# Patient Record
Sex: Male | Born: 1970 | Race: White | Hispanic: No | Smoking: Current every day smoker
Health system: Southern US, Community
[De-identification: ages and names within clinical notes are randomized; demographics above are authoritative.]

## PROBLEM LIST (undated history)

## (undated) DIAGNOSIS — S52501A Unspecified fracture of the lower end of right radius, initial encounter for closed fracture: Secondary | ICD-10-CM

## (undated) DIAGNOSIS — E785 Hyperlipidemia, unspecified: Secondary | ICD-10-CM

## (undated) DIAGNOSIS — F1910 Other psychoactive substance abuse, uncomplicated: Secondary | ICD-10-CM

## (undated) DIAGNOSIS — F1010 Alcohol abuse, uncomplicated: Secondary | ICD-10-CM

## (undated) HISTORY — PX: SHOULDER SURGERY: SHX246

---

## 2001-01-19 ENCOUNTER — Encounter: Payer: Self-pay | Admitting: *Deleted

## 2001-01-19 ENCOUNTER — Emergency Department (HOSPITAL_COMMUNITY): Admission: EM | Admit: 2001-01-19 | Discharge: 2001-01-19 | Payer: Self-pay | Admitting: *Deleted

## 2001-07-02 ENCOUNTER — Emergency Department (HOSPITAL_COMMUNITY): Admission: EM | Admit: 2001-07-02 | Discharge: 2001-07-02 | Payer: Self-pay | Admitting: Internal Medicine

## 2001-08-05 ENCOUNTER — Emergency Department (HOSPITAL_COMMUNITY): Admission: EM | Admit: 2001-08-05 | Discharge: 2001-08-06 | Payer: Self-pay | Admitting: Emergency Medicine

## 2001-08-06 ENCOUNTER — Encounter: Payer: Self-pay | Admitting: Emergency Medicine

## 2001-10-19 ENCOUNTER — Encounter: Payer: Self-pay | Admitting: *Deleted

## 2001-10-19 ENCOUNTER — Emergency Department (HOSPITAL_COMMUNITY): Admission: EM | Admit: 2001-10-19 | Discharge: 2001-10-19 | Payer: Self-pay | Admitting: *Deleted

## 2001-11-29 ENCOUNTER — Emergency Department (HOSPITAL_COMMUNITY): Admission: EM | Admit: 2001-11-29 | Discharge: 2001-11-29 | Payer: Self-pay | Admitting: *Deleted

## 2002-09-20 ENCOUNTER — Emergency Department (HOSPITAL_COMMUNITY): Admission: EM | Admit: 2002-09-20 | Discharge: 2002-09-20 | Payer: Self-pay | Admitting: *Deleted

## 2004-12-28 ENCOUNTER — Emergency Department (HOSPITAL_COMMUNITY): Admission: EM | Admit: 2004-12-28 | Discharge: 2004-12-28 | Payer: Self-pay | Admitting: Emergency Medicine

## 2005-01-14 ENCOUNTER — Emergency Department (HOSPITAL_COMMUNITY): Admission: EM | Admit: 2005-01-14 | Discharge: 2005-01-14 | Payer: Self-pay | Admitting: Emergency Medicine

## 2005-02-06 ENCOUNTER — Emergency Department (HOSPITAL_COMMUNITY): Admission: EM | Admit: 2005-02-06 | Discharge: 2005-02-06 | Payer: Self-pay | Admitting: *Deleted

## 2013-08-29 ENCOUNTER — Emergency Department (HOSPITAL_COMMUNITY)
Admission: EM | Admit: 2013-08-29 | Discharge: 2013-08-30 | Disposition: A | Discharge status: Home / Self Care | Payer: Self-pay | Attending: Emergency Medicine | Admitting: Emergency Medicine

## 2013-08-29 ENCOUNTER — Encounter (HOSPITAL_COMMUNITY): Payer: Self-pay | Admitting: Emergency Medicine

## 2013-08-29 DIAGNOSIS — F17200 Nicotine dependence, unspecified, uncomplicated: Secondary | ICD-10-CM | POA: Insufficient documentation

## 2013-08-29 DIAGNOSIS — N419 Inflammatory disease of prostate, unspecified: Secondary | ICD-10-CM | POA: Insufficient documentation

## 2013-08-29 DIAGNOSIS — Z862 Personal history of diseases of the blood and blood-forming organs and certain disorders involving the immune mechanism: Secondary | ICD-10-CM | POA: Insufficient documentation

## 2013-08-29 DIAGNOSIS — Z8639 Personal history of other endocrine, nutritional and metabolic disease: Secondary | ICD-10-CM | POA: Insufficient documentation

## 2013-08-29 HISTORY — DX: Hyperlipidemia, unspecified: E78.5

## 2013-08-29 MED ORDER — CIPROFLOXACIN HCL 500 MG PO TABS: 500.0000 mg | ORAL_TABLET | Freq: Two times a day (BID) | ORAL | Status: DC

## 2013-08-29 MED ORDER — CEFTRIAXONE SODIUM 250 MG IJ SOLR
250.0000 mg | Freq: Once | INTRAMUSCULAR | Status: AC
  Administered 2013-08-30: 250 mg via INTRAMUSCULAR
  Filled 2013-08-29: qty 250

## 2013-08-29 MED ORDER — CIPROFLOXACIN HCL 250 MG PO TABS
500.0000 mg | ORAL_TABLET | Freq: Once | ORAL | Status: AC
  Administered 2013-08-30: 500 mg via ORAL
  Filled 2013-08-29: qty 2

## 2013-08-29 NOTE — ED Provider Notes (Signed)
CSN: [id number]     Arrival date & time 08/29/13  2317 History   First MD Initiated Contact with Patient 08/29/13 2342     Chief Complaint  Patient presents with  . Penis Pain     (Consider location/radiation/quality/duration/timing/severity/associated sxs/prior Treatment) HPI This is a 43 year old male with a two-week history of pain in the end of his penis when he urinates. He states the symptoms are getting worse. He states the pain is severe. He describes it as a sharp pain. He denies urethral discharge. He denies fever, chills, nausea, vomiting or diarrhea. He denies pain with defecation. He states he has not had vaginal intercourse in over 6 months but has had oral sex recently.  Past Medical History  Diagnosis Date  . Hyperlipemia    History reviewed. No pertinent past surgical history. No family history on file. History  Substance Use Topics  . Smoking status: Current Every Day Smoker -- 1.00 packs/day    Types: Cigarettes  . Smokeless tobacco: Not on file  . Alcohol Use: No    Review of Systems  All other systems reviewed and are negative.   Allergies  Review of patient's allergies indicates no known allergies.  Home Medications  No current outpatient prescriptions on file. BP 142/96  Pulse 81  Temp(Src) 97.7 F (36.5 C) (Oral)  Resp 18  Ht 5\' 6"  (1.676 m)  Wt 160 lb (72.576 kg)  BMI 25.84 kg/m2  SpO2 97%  Physical Exam General: Well-developed, well-nourished male in no acute distress; appearance consistent with age of record HENT: normocephalic; atraumatic; edentulous Eyes: pupils equal, round and reactive to light; extraocular muscles intact Neck: supple Heart: regular rate and rhythm Lungs: clear to auscultation bilaterally Abdomen: soft; nondistended; mild suprapubic tenderness; no masses or hepatosplenomegaly; bowel sounds present GU: Tanner 4 male, uncircumcised; no urethral discharge; no meatal erythema or swelling Rectal: Normal sphincter tone;  nontender prostate Extremities: No deformity; full range of motion Neurologic: Awake, alert and oriented; motor function intact in all extremities and symmetric; no facial droop Skin: Warm and dry Psychiatric: Normal mood and affect    ED Course  Procedures (including critical care time)  MDM  We'll treat for prostatitis. GC/Chlamydia pending.    Hanley SeamenJohn L Larra Crunkleton, MD 08/29/13 [phone]

## 2013-08-29 NOTE — ED Notes (Signed)
Pt states having pain at the head of his penis for the past week. Denies any discharge or drainage. Only burning w/ urination.

## 2013-08-29 NOTE — Discharge Instructions (Signed)
Prostatitis The prostate gland is about the size and shape of a walnut. It is located just below your bladder. It produces one of the components of semen, which is made up of sperm and the fluids that help nourish and transport it out from the testicles. Prostatitis is inflammation of the prostate gland.  There are four types of prostatitis:  Acute bacterial prostatitis This is the least common type of prostatitis. It starts quickly and usually is associated with a bladder infection, high fever, and shaking chills. It can occur at any age.  Chronic bacterial prostatitis This is a persistent bacterial infection in the prostate. It usually develops from repeated acute bacterial prostatitis or acute bacterial prostatitis that was not properly treated. It can occur in men of any age but is most common in middle-aged men whose prostate has begun to enlarge. The symptoms are not as severe as those in acute bacterial prostatitis. Discomfort in the part of your body that is in front of your rectum and below your scrotum (perineum), lower abdomen, or in the head of your penis (glans) may represent your primary discomfort.  Chronic prostatitis (nonbacterial) This is the most common type of prostatitis. It is inflammation of the prostate gland that is not caused by a bacterial infection. The cause is unknown and may be associated with a viral infection or autoimmune disorder.  Prostatodynia (pelvic floor disorder) This is associated with increased muscular tone in the pelvis surrounding the prostate. CAUSES The causes of bacterial prostatitis are bacterial infection. The causes of the other types of prostatitis are unknown.  SYMPTOMS  Symptoms can vary depending upon the type of prostatitis that exists. There can also be overlap in symptoms. Possible symptoms for each type of prostatitis are listed below. Acute Bacterial Prostatitis  Painful urination.  Fever or chills.  Muscle or joint pains.  Low  back pain.  Low abdominal pain.  Inability to empty bladder completely. Chronic Bacterial Prostatitis, Chronic Nonbacterial Prostatitis, and Prostatodynia  Sudden urge to urinate.  Frequent urination.  Difficulty starting urine stream.  Weak urine stream.  Discharge from the urethra.  Dribbling after urination.  Rectal pain.  Pain in the testicles, penis, or tip of the penis.  Pain in the perineum.  Problems with sexual function.  Painful ejaculation.  Bloody semen. DIAGNOSIS  In order to diagnose prostatitis, your health care provider will ask about your symptoms. One or more urine samples will be taken and tested (urinalysis). If the urinalysis result is negative for bacteria, your health care provider may use a finger to feel your prostate (digital rectal exam). This exam helps your health care provider determine if your prostate is swollen and tender. It will also produce a specimen of semen that can be analyzed. TREATMENT  Treatment for prostatitis depends on the cause. If a bacterial infection is the cause, it can be treated with antibiotic medicine. In cases of chronic bacterial prostatitis, the use of antibiotics for up to 1 month or 6 weeks may be necessary. Your health care provider may instruct you to take sitz baths to help relieve pain. A sitz bath is a bath of hot water in which your hips and buttocks are under water. This relaxes the pelvic floor muscles and often helps to relieve the pressure on your prostate. HOME CARE INSTRUCTIONS   Take all medicines as directed by your health care provider.  Take sitz baths as directed by your health care provider. SEEK MEDICAL CARE IF:   Your symptoms   get worse, not better.  You have a fever. SEEK IMMEDIATE MEDICAL CARE IF:   You have chills.  You feel nauseous or vomit.  You feel lightheaded or faint.  You are unable to urinate.  You have blood or blood clots in your urine. Document Released: 06/26/2000  Document Revised: 04/19/2013 Document Reviewed: 01/16/2013 [employer] Patient Information 2014 [employer].  

## 2013-08-30 MED ORDER — LIDOCAINE HCL (PF) 1 % IJ SOLN
INTRAMUSCULAR | Status: AC
  Filled 2013-08-30: qty 5

## 2013-08-30 NOTE — ED Notes (Addendum)
Pt alert & oriented x4, stable gait. Patient  given discharge instructions, paperwork & prescription(s). Pt requesting something for pain, pt left papers & prescriptions in the room stating he did not need them.  Patient  instructed to stop at the registration desk to finish any additional paperwork. Patient  verbalized understanding. Pt left department w/ no further questions.

## 2013-08-31 LAB — URINE CULTURE
COLONY COUNT: NO GROWTH
Culture: NO GROWTH

## 2013-08-31 LAB — GC/CHLAMYDIA PROBE AMP
CT PROBE, AMP APTIMA: NEGATIVE
GC Probe RNA: NEGATIVE

## 2013-11-12 ENCOUNTER — Emergency Department (HOSPITAL_COMMUNITY): Payer: Self-pay

## 2013-11-12 ENCOUNTER — Encounter (HOSPITAL_COMMUNITY): Payer: Self-pay | Admitting: Emergency Medicine

## 2013-11-12 ENCOUNTER — Emergency Department (HOSPITAL_COMMUNITY)
Admission: EM | Admit: 2013-11-12 | Discharge: 2013-11-12 | Disposition: A | Discharge status: Home / Self Care | Payer: Self-pay | Attending: Emergency Medicine | Admitting: Emergency Medicine

## 2013-11-12 DIAGNOSIS — Z862 Personal history of diseases of the blood and blood-forming organs and certain disorders involving the immune mechanism: Secondary | ICD-10-CM | POA: Insufficient documentation

## 2013-11-12 DIAGNOSIS — S42001A Fracture of unspecified part of right clavicle, initial encounter for closed fracture: Secondary | ICD-10-CM

## 2013-11-12 DIAGNOSIS — R296 Repeated falls: Secondary | ICD-10-CM | POA: Insufficient documentation

## 2013-11-12 DIAGNOSIS — Z792 Long term (current) use of antibiotics: Secondary | ICD-10-CM | POA: Insufficient documentation

## 2013-11-12 DIAGNOSIS — F17200 Nicotine dependence, unspecified, uncomplicated: Secondary | ICD-10-CM | POA: Insufficient documentation

## 2013-11-12 DIAGNOSIS — Y9389 Activity, other specified: Secondary | ICD-10-CM | POA: Insufficient documentation

## 2013-11-12 DIAGNOSIS — Z8639 Personal history of other endocrine, nutritional and metabolic disease: Secondary | ICD-10-CM | POA: Insufficient documentation

## 2013-11-12 DIAGNOSIS — F1010 Alcohol abuse, uncomplicated: Secondary | ICD-10-CM | POA: Insufficient documentation

## 2013-11-12 DIAGNOSIS — F10929 Alcohol use, unspecified with intoxication, unspecified: Secondary | ICD-10-CM

## 2013-11-12 DIAGNOSIS — Y929 Unspecified place or not applicable: Secondary | ICD-10-CM | POA: Insufficient documentation

## 2013-11-12 DIAGNOSIS — S42023A Displaced fracture of shaft of unspecified clavicle, initial encounter for closed fracture: Secondary | ICD-10-CM | POA: Insufficient documentation

## 2013-11-12 MED ORDER — TRAMADOL HCL 50 MG PO TABS: 50.0000 mg | ORAL_TABLET | Freq: Four times a day (QID) | ORAL | Status: DC | PRN

## 2013-11-12 NOTE — Discharge Instructions (Signed)
Clavicle Fracture A clavicle fracture is a break in the collarbone. This is a common injury, especially in children. Collarbones do not harden until around the age of 45. Most collarbone fractures are treated with a simple arm sling. In some cases a figure-of-eight splint is used to help hold the broken bones in position. Although not often needed, surgery may be required if the bone fragments are not in the correct position (displaced).  HOME CARE INSTRUCTIONS   Apply ice to the injury for 15-20 minutes each hour while awake for 2 days. Put the ice in a plastic bag and place a towel between the bag of ice and your skin.  Wear the sling or splint constantly for as long as directed by your caregiver. You may remove the sling or splint for bathing or showering. Be sure to keep your shoulder in the same place as when the sling or splint is on. Do not lift your arm.  If a figure-of-eight splint is applied, it must be tightened by another person every day. Tighten it enough to keep the shoulders held back. Allow enough room to place the index finger between the body and strap. Loosen the splint immediately if you feel numbness or tingling in your hands.  Only take over-the-counter or prescription medicines for pain, discomfort, or fever as directed by your caregiver.  Avoid activities that irritate or increase the pain for 4 to 6 weeks after surgery.  Follow all instructions for follow-up with your caregiver. This includes any referrals, physical therapy, and rehabilitation. Any delay in obtaining necessary care could result in a delay or failure of the injury to heal properly. SEEK MEDICAL CARE IF:  You have pain and swelling that are not relieved with medications. SEEK IMMEDIATE MEDICAL CARE IF:  Your arm is numb, cold, or pale, even when the splint is loose. MAKE SURE YOU:   Understand these instructions.  Will watch your condition.  Will get help right away if you are not doing well or get  worse. Document Released: 04/08/2005 Document Revised: 09/21/2011 Document Reviewed: 02/02/2008 [facility]allExitCare Patient Information 2014 [place], [place].  Alcohol Intoxication Alcohol intoxication occurs when the amount of alcohol that a person has consumed impairs his or her ability to mentally and physically function. Alcohol directly impairs the normal chemical activity of the brain. Drinking large amounts of alcohol can lead to changes in mental function and behavior, and it can cause many physical effects that can be harmful.  Alcohol intoxication can range in severity from mild to very severe. Various factors can affect the level of intoxication that occurs, such as the person's age, gender, weight, frequency of alcohol consumption, and the presence of other medical conditions (such as diabetes, seizures, or heart conditions). Dangerous levels of alcohol intoxication may occur when people drink large amounts of alcohol in a short period (binge drinking). Alcohol can also be especially dangerous when combined with certain prescription medicines or "recreational" drugs. SIGNS AND SYMPTOMS Some common signs and symptoms of mild alcohol intoxication include:  Loss of coordination.  Changes in mood and behavior.  Impaired judgment.  Slurred speech. As alcohol intoxication progresses to more severe levels, other signs and symptoms will appear. These may include:  Vomiting.  Confusion and impaired memory.  Slowed breathing.  Seizures.  Loss of consciousness. DIAGNOSIS  Your health care provider will take a medical history and perform a physical exam. You will be asked about the amount and type of alcohol you have consumed. Blood tests will  be done to measure the concentration of alcohol in your blood. In many places, your blood alcohol level must be lower than 80 mg/dL (0.98%0.08%) to legally drive. However, many dangerous effects of alcohol can occur at much lower levels.  TREATMENT  People with  alcohol intoxication often do not require treatment. Most of the effects of alcohol intoxication are temporary, and they go away as the alcohol naturally leaves the body. Your health care provider will monitor your condition until you are stable enough to go home. Fluids are sometimes given through an IV access tube to help prevent dehydration.  HOME CARE INSTRUCTIONS  Do not drive after drinking alcohol.  Stay hydrated. Drink enough water and fluids to keep your urine clear or pale yellow. Avoid caffeine.   Only take over-the-counter or prescription medicines as directed by your health care provider.  SEEK MEDICAL CARE IF:   You have persistent vomiting.   You do not feel better after a few days.  You have frequent alcohol intoxication. Your health care provider can help determine if you should see a substance use treatment counselor. SEEK IMMEDIATE MEDICAL CARE IF:   You become shaky or tremble when you try to stop drinking.   You shake uncontrollably (seizure).   You throw up (vomit) blood. This may be bright red or may look like black coffee grounds.   You have blood in your stool. This may be bright red or may appear as a black, tarry, bad smelling stool.   You become lightheaded or faint.  MAKE SURE YOU:   Understand these instructions.  Will watch your condition.  Will get help right away if you are not doing well or get worse. Document Released: 04/08/2005 Document Revised: 03/01/2013 Document Reviewed: 12/02/2012 [facility]CExitCare Patient Information 2014 [place]ExitCare, [place]LL[place].

## 2013-11-12 NOTE — ED Provider Notes (Signed)
CSN: [id number]     Arrival date & time 11/12/13  0133 History   First MD Initiated Contact with Patient 11/12/13 0151     Chief Complaint  Patient presents with  . Shoulder Injury     (Consider location/radiation/quality/duration/timing/severity/associated sxs/prior Treatment) HPI Patient states he fell onto his right shoulder 3 days ago. He denies any head or neck injury at the time. He's had ongoing right shoulder pain and deformity since. He denies any weakness or numbness. The patient is a poor historian admits to drinking several beers this evening. Past Medical History  Diagnosis Date  . Hyperlipemia    History reviewed. No pertinent past surgical history. No family history on file. History  Substance Use Topics  . Smoking status: Current Every Day Smoker -- 1.00 packs/day    Types: Cigarettes  . Smokeless tobacco: Not on file  . Alcohol Use: No    Review of Systems  Constitutional: Negative for fever and chills.  Respiratory: Negative for cough and shortness of breath.   Cardiovascular: Negative for chest pain.  Gastrointestinal: Negative for nausea, vomiting and abdominal pain.  Musculoskeletal: Positive for arthralgias. Negative for back pain, myalgias, neck pain and neck stiffness.  Skin: Negative for rash and wound.  Neurological: Negative for syncope, weakness, light-headedness and numbness.  All other systems reviewed and are negative.     Allergies  Review of patient's allergies indicates no known allergies.  Home Medications   Prior to Admission medications   Medication Sig Start Date End Date Taking? Authorizing Provider  ciprofloxacin (CIPRO) 500 MG tablet Take 1 tablet (500 mg total) by mouth 2 (two) times daily. 08/29/13   Carlisle BeersJohn L Molpus, MD   BP 114/79  Pulse 72  Temp(Src) 98.2 F (36.8 C) (Oral)  Resp 20  Ht 5\' 6"  (1.676 m)  Wt 160 lb (72.576 kg)  BMI 25.84 kg/m2  SpO2 96% Physical Exam  Nursing note and vitals reviewed. Constitutional: He  is oriented to person, place, and time. He appears well-developed and well-nourished.  Drowsy but arousable. Speaks with slurred speech. Clinically intoxicated.  HENT:  Head: Normocephalic and atraumatic.  Mouth/Throat: Oropharynx is clear and moist.  Eyes: EOM are normal. Pupils are equal, round, and reactive to light.  Neck: Normal range of motion. Neck supple.  No posterior midline cervical tenderness to palpation.  Cardiovascular: Normal rate and regular rhythm.   Pulmonary/Chest: Effort normal and breath sounds normal. No respiratory distress. He has no wheezes. He has no rales.  Abdominal: Soft. Bowel sounds are normal.  Musculoskeletal: Normal range of motion. He exhibits no edema and no tenderness.  Patient with a right mid clavicle deformity and evidence of ecchymosis. He has decreased range of motion of the right shoulder due to pain. He has 2+ radial pulses  Neurological: He is alert and oriented to person, place, and time.  5/5 motor in all extremities. Sensation is intact.  Skin: Skin is warm and dry. No rash noted. No erythema.  Psychiatric: He has a normal mood and affect. His behavior is normal.    ED Course  Procedures (including critical care time) Labs Review Labs Reviewed - No data to display  Imaging Review Dg Shoulder Right  11/12/2013   CLINICAL DATA:  Right shoulder pain after a fall 2 days ago.  EXAM: RIGHT SHOULDER - 2+ VIEW  COMPARISON:  None.  FINDINGS: Comminuted fractures of the midshaft right clavicle with inferior displacement and overriding of Mr. Flexure fragment. Coracoclavicular and acromioclavicular spaces are  maintained.  IMPRESSION: Comminuted displaced fractures of the midshaft right clavicle.   Electronically Signed   By: Burman NievesWilliam  Stevens M.D.   On: 11/12/2013 02:10     EKG Interpretation None      MDM   Final diagnoses:  None   Comminuted midshaft right clavicle fracture. Placed in shoulder immobilizer. Will need to follow up with  orthopedist  Patient is now more alert. He will call his daughter to pick him up. He is advised to followup with orthopedist for possible repair of comminuted clavicular fracture. He is in place and a shoulder immobilizer. Return precautions have been given.  Loren Raceravid Jerilee Space, MD 11/12/13 [phone]

## 2013-11-12 NOTE — ED Notes (Signed)
Pt left without paperwork

## 2013-11-12 NOTE — ED Notes (Signed)
Pt states he fell about 4 foot 3 days ago landing on his right shoulder

## 2013-12-23 ENCOUNTER — Emergency Department (HOSPITAL_COMMUNITY): Payer: Self-pay

## 2013-12-23 ENCOUNTER — Emergency Department (HOSPITAL_COMMUNITY)
Admission: EM | Admit: 2013-12-23 | Discharge: 2013-12-23 | Disposition: A | Discharge status: Home / Self Care | Payer: Self-pay | Attending: Emergency Medicine | Admitting: Emergency Medicine

## 2013-12-23 ENCOUNTER — Encounter (HOSPITAL_COMMUNITY): Payer: Self-pay | Admitting: Emergency Medicine

## 2013-12-23 DIAGNOSIS — Y929 Unspecified place or not applicable: Secondary | ICD-10-CM | POA: Insufficient documentation

## 2013-12-23 DIAGNOSIS — F17200 Nicotine dependence, unspecified, uncomplicated: Secondary | ICD-10-CM | POA: Insufficient documentation

## 2013-12-23 DIAGNOSIS — S42001A Fracture of unspecified part of right clavicle, initial encounter for closed fracture: Secondary | ICD-10-CM

## 2013-12-23 DIAGNOSIS — S42023A Displaced fracture of shaft of unspecified clavicle, initial encounter for closed fracture: Secondary | ICD-10-CM | POA: Insufficient documentation

## 2013-12-23 DIAGNOSIS — Z8639 Personal history of other endocrine, nutritional and metabolic disease: Secondary | ICD-10-CM | POA: Insufficient documentation

## 2013-12-23 DIAGNOSIS — Y9389 Activity, other specified: Secondary | ICD-10-CM | POA: Insufficient documentation

## 2013-12-23 DIAGNOSIS — Z862 Personal history of diseases of the blood and blood-forming organs and certain disorders involving the immune mechanism: Secondary | ICD-10-CM | POA: Insufficient documentation

## 2013-12-23 DIAGNOSIS — X500XXA Overexertion from strenuous movement or load, initial encounter: Secondary | ICD-10-CM | POA: Insufficient documentation

## 2013-12-23 DIAGNOSIS — Z792 Long term (current) use of antibiotics: Secondary | ICD-10-CM | POA: Insufficient documentation

## 2013-12-23 MED ORDER — IBUPROFEN 800 MG PO TABS
800.0000 mg | ORAL_TABLET | Freq: Once | ORAL | Status: AC
  Administered 2013-12-23: 800 mg via ORAL
  Filled 2013-12-23: qty 1

## 2013-12-23 MED ORDER — OXYCODONE-ACETAMINOPHEN 5-325 MG PO TABS
2.0000 | ORAL_TABLET | Freq: Once | ORAL | Status: AC
  Administered 2013-12-23: 2 via ORAL
  Filled 2013-12-23: qty 2

## 2013-12-23 MED ORDER — OXYCODONE-ACETAMINOPHEN 5-325 MG PO TABS: 1.0000 | ORAL_TABLET | ORAL | Status: DC | PRN

## 2013-12-23 NOTE — Discharge Instructions (Signed)
Be sure to wear your immobilizer and followup with orthopedic doctor this week  Clavicle Fracture A clavicle fracture is a break in the collarbone. This is a common injury, especially in children. Collarbones do not harden until around the age of 45. Most collarbone fractures are treated with a simple arm sling. In some cases a figure-of-eight splint is used to help hold the broken bones in position. Although not often needed, surgery may be required if the bone fragments are not in the correct position (displaced).  HOME CARE INSTRUCTIONS   Apply ice to the injury for 15-20 minutes each hour while awake for 2 days. Put the ice in a plastic bag and place a towel between the bag of ice and your skin.  Wear the sling or splint constantly for as long as directed by your caregiver. You may remove the sling or splint for bathing or showering. Be sure to keep your shoulder in the same place as when the sling or splint is on. Do not lift your arm.  If a figure-of-eight splint is applied, it must be tightened by another person every day. Tighten it enough to keep the shoulders held back. Allow enough room to place the index finger between the body and strap. Loosen the splint immediately if you feel numbness or tingling in your hands.  Only take over-the-counter or prescription medicines for pain, discomfort, or fever as directed by your caregiver.  Avoid activities that irritate or increase the pain for 4 to 6 weeks after surgery.  Follow all instructions for follow-up with your caregiver. This includes any referrals, physical therapy, and rehabilitation. Any delay in obtaining necessary care could result in a delay or failure of the injury to heal properly. SEEK MEDICAL CARE IF:  You have pain and swelling that are not relieved with medications. SEEK IMMEDIATE MEDICAL CARE IF:  Your arm is numb, cold, or pale, even when the splint is loose. MAKE SURE YOU:   Understand these instructions.  Will  watch your condition.  Will get help right away if you are not doing well or get worse. Document Released: 04/08/2005 Document Revised: 09/21/2011 Document Reviewed: 02/02/2008 [facility]Exit[facility] Patient Information 2014 [place]ExitCare, [place]LLC.

## 2013-12-23 NOTE — ED Notes (Signed)
Pt c/o pain to right shoulder; pt states he was picking up 50 gallon bucket of paint and had pain. Good pulses to hand and wrist
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
## 2013-12-23 NOTE — ED Notes (Signed)
Patient with no complaints at this time. Respirations even and unlabored. Skin warm/dry. Discharge instructions reviewed with patient at this time. Patient given opportunity to voice concerns/ask questions. Patient discharged at this time and left Emergency Department with steady gait.   

## 2013-12-24 ENCOUNTER — Encounter (HOSPITAL_COMMUNITY): Payer: Self-pay | Admitting: Emergency Medicine

## 2013-12-24 ENCOUNTER — Emergency Department (HOSPITAL_COMMUNITY)
Admission: EM | Admit: 2013-12-24 | Discharge: 2013-12-25 | Disposition: A | Discharge status: Home / Self Care | Payer: Self-pay | Attending: Emergency Medicine | Admitting: Emergency Medicine

## 2013-12-24 DIAGNOSIS — X500XXA Overexertion from strenuous movement or load, initial encounter: Secondary | ICD-10-CM | POA: Insufficient documentation

## 2013-12-24 DIAGNOSIS — R296 Repeated falls: Secondary | ICD-10-CM | POA: Insufficient documentation

## 2013-12-24 DIAGNOSIS — Z862 Personal history of diseases of the blood and blood-forming organs and certain disorders involving the immune mechanism: Secondary | ICD-10-CM | POA: Insufficient documentation

## 2013-12-24 DIAGNOSIS — Y929 Unspecified place or not applicable: Secondary | ICD-10-CM | POA: Insufficient documentation

## 2013-12-24 DIAGNOSIS — S42033A Displaced fracture of lateral end of unspecified clavicle, initial encounter for closed fracture: Secondary | ICD-10-CM | POA: Insufficient documentation

## 2013-12-24 DIAGNOSIS — Y9389 Activity, other specified: Secondary | ICD-10-CM | POA: Insufficient documentation

## 2013-12-24 DIAGNOSIS — S42009A Fracture of unspecified part of unspecified clavicle, initial encounter for closed fracture: Secondary | ICD-10-CM

## 2013-12-24 DIAGNOSIS — Z8639 Personal history of other endocrine, nutritional and metabolic disease: Secondary | ICD-10-CM | POA: Insufficient documentation

## 2013-12-24 DIAGNOSIS — F17200 Nicotine dependence, unspecified, uncomplicated: Secondary | ICD-10-CM | POA: Insufficient documentation

## 2013-12-24 DIAGNOSIS — Z765 Malingerer [conscious simulation]: Secondary | ICD-10-CM | POA: Insufficient documentation

## 2013-12-24 NOTE — ED Notes (Signed)
EMS states when asked what he needed to be seen for tonight, he told EMS that she needed to start an IV and give him Morphine.  Patient also told EMS not to tell us that he had been drinking ETOH tonight because we won't give him pain medicine.  Patient c/o right shoulder pain; states right shoulder is dislocated.

## 2013-12-25 ENCOUNTER — Telehealth: Payer: Self-pay | Admitting: Orthopedic Surgery

## 2013-12-25 MED ORDER — KETOROLAC TROMETHAMINE 60 MG/2ML IM SOLN
INTRAMUSCULAR | Status: AC
  Filled 2013-12-25: qty 2

## 2013-12-25 MED ORDER — KETOROLAC TROMETHAMINE 60 MG/2ML IM SOLN
60.0000 mg | Freq: Once | INTRAMUSCULAR | Status: AC
  Administered 2013-12-25: 60 mg via INTRAMUSCULAR

## 2013-12-25 NOTE — Telephone Encounter (Signed)
Received call from patient, following Emergency room visit at [facility] for problem of clavicle fracture.  Offered appointment for this week; patient states unable to make the minimum self-pay partial payment.  He is to check on when he can arrange this, and call back to schedule.

## 2013-12-25 NOTE — ED Notes (Signed)
Pt alert & oriented x4, stable gait. Patient given discharge instructions, paperwork & prescription(s). Patient  instructed to stop at the registration desk to finish any additional paperwork. Patient verbalized understanding. Pt left department w/ no further questions. 

## 2013-12-25 NOTE — Telephone Encounter (Signed)
Additional note in reference to appointment scheduled for Emergency room follow up, for 12/28/13 for right shoulder/clavicle problem:  In trash basket in rest room(located in waiting room)  found patient's appointment card given to him earlier today along with the blank new patient forms. He evidently disposed of them immediately upon leaving our office, at time of scheduling the visit.  We have kept the appointment on our provider schedule for this visit.
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
## 2013-12-25 NOTE — Telephone Encounter (Signed)
Patient stopped by office shortly after the telephone conversation, again relaying that he has no insurance and would like to pay something on Friday, but requests to schedule immediately.  Upon speaking with our site manager, and reviewing schedule, patient did schedule for Thursday, 12/21/13.  He is aware of the form and payment requirements, and was given the paperwork, which indicates that if any of the information was not brought to the visit, appointment may be re-scheduled.  In addition, patient was not wearing any sling or brace which he would have been given by the Emergency Room -- he said "it was too hot, so I had to take it off."  I encouraged patient to wear as instructed in order to keep the shoulder stable, and stressed the importance of complying with the instructions.

## 2013-12-25 NOTE — Discharge Instructions (Signed)
Clavicle Fracture °A clavicle fracture is a break in the collarbone. This is a common injury, especially in children. Collarbones do not harden until around the age of 20. Most collarbone fractures are treated with a simple arm sling. In some cases a figure-of-eight splint is used to help hold the broken bones in position. Although not often needed, surgery may be required if the bone fragments are not in the correct position (displaced).  °HOME CARE INSTRUCTIONS  °· Apply ice to the injury for 15-20 minutes each hour while awake for 2 days. Put the ice in a plastic bag and place a towel between the bag of ice and your skin. °· Wear the sling or splint constantly for as long as directed by your caregiver. You may remove the sling or splint for bathing or showering. Be sure to keep your shoulder in the same place as when the sling or splint is on. Do not lift your arm. °· If a figure-of-eight splint is applied, it must be tightened by another person every day. Tighten it enough to keep the shoulders held back. Allow enough room to place the index finger between the body and strap. Loosen the splint immediately if you feel numbness or tingling in your hands. °· Only take over-the-counter or prescription medicines for pain, discomfort, or fever as directed by your caregiver. °· Avoid activities that irritate or increase the pain for 4 to 6 weeks after surgery. °· Follow all instructions for follow-up with your caregiver. This includes any referrals, physical therapy, and rehabilitation. Any delay in obtaining necessary care could result in a delay or failure of the injury to heal properly. °SEEK MEDICAL CARE IF:  °You have pain and swelling that are not relieved with medications. °SEEK IMMEDIATE MEDICAL CARE IF:  °Your arm is numb, cold, or pale, even when the splint is loose. °MAKE SURE YOU:  °· Understand these instructions. °· Will watch your condition. °· Will get help right away if you are not doing well or get  worse. °Document Released: 04/08/2005 Document Revised: 09/21/2011 Document Reviewed: 02/02/2008 °ExitCare® Patient Information ©2014 [employer]. ° °

## 2013-12-25 NOTE — ED Notes (Signed)
Pt arrived by EMS, pt states he tripped hurting shoulder. Pt was seen in the ER for the same yesterday. Pt states he can not find the shoulder immobilizer from a month ago for the same.

## 2013-12-25 NOTE — ED Provider Notes (Signed)
Medical screening examination/treatment/procedure(s) were performed by non-physician practitioner and as supervising physician I was immediately available for consultation/collaboration.      Hanley SeamenJohn L Veronique Warga, MD 12/25/13 [phone]

## 2013-12-28 ENCOUNTER — Encounter: Payer: Self-pay | Admitting: Orthopedic Surgery

## 2013-12-28 ENCOUNTER — Ambulatory Visit: Payer: Self-pay | Admitting: Orthopedic Surgery

## 2014-01-08 ENCOUNTER — Encounter (HOSPITAL_COMMUNITY): Payer: Self-pay | Admitting: Emergency Medicine

## 2014-01-08 ENCOUNTER — Emergency Department (HOSPITAL_COMMUNITY): Payer: Self-pay

## 2014-01-08 ENCOUNTER — Emergency Department (HOSPITAL_COMMUNITY)
Admission: EM | Admit: 2014-01-08 | Discharge: 2014-01-08 | Disposition: A | Discharge status: Home / Self Care | Payer: Self-pay | Attending: Emergency Medicine | Admitting: Emergency Medicine

## 2014-01-08 DIAGNOSIS — Z8781 Personal history of (healed) traumatic fracture: Secondary | ICD-10-CM | POA: Insufficient documentation

## 2014-01-08 DIAGNOSIS — Y929 Unspecified place or not applicable: Secondary | ICD-10-CM | POA: Insufficient documentation

## 2014-01-08 DIAGNOSIS — Y9389 Activity, other specified: Secondary | ICD-10-CM | POA: Insufficient documentation

## 2014-01-08 DIAGNOSIS — M25511 Pain in right shoulder: Secondary | ICD-10-CM

## 2014-01-08 DIAGNOSIS — S46909A Unspecified injury of unspecified muscle, fascia and tendon at shoulder and upper arm level, unspecified arm, initial encounter: Secondary | ICD-10-CM | POA: Insufficient documentation

## 2014-01-08 DIAGNOSIS — S4980XA Other specified injuries of shoulder and upper arm, unspecified arm, initial encounter: Secondary | ICD-10-CM | POA: Insufficient documentation

## 2014-01-08 DIAGNOSIS — F17200 Nicotine dependence, unspecified, uncomplicated: Secondary | ICD-10-CM | POA: Insufficient documentation

## 2014-01-08 DIAGNOSIS — X500XXA Overexertion from strenuous movement or load, initial encounter: Secondary | ICD-10-CM | POA: Insufficient documentation

## 2014-01-08 DIAGNOSIS — Z8639 Personal history of other endocrine, nutritional and metabolic disease: Secondary | ICD-10-CM | POA: Insufficient documentation

## 2014-01-08 DIAGNOSIS — Z862 Personal history of diseases of the blood and blood-forming organs and certain disorders involving the immune mechanism: Secondary | ICD-10-CM | POA: Insufficient documentation

## 2014-01-08 DIAGNOSIS — G8929 Other chronic pain: Secondary | ICD-10-CM | POA: Insufficient documentation

## 2014-01-08 MED ORDER — BACLOFEN 10 MG PO TABS: 10.0000 mg | ORAL_TABLET | Freq: Three times a day (TID) | ORAL | Status: AC

## 2014-01-08 MED ORDER — IBUPROFEN 800 MG PO TABS
800.0000 mg | ORAL_TABLET | Freq: Once | ORAL | Status: AC
  Administered 2014-01-08: 800 mg via ORAL

## 2014-01-08 MED ORDER — IBUPROFEN 400 MG PO TABS
400.0000 mg | ORAL_TABLET | Freq: Once | ORAL | Status: DC
  Filled 2014-01-08: qty 1

## 2014-01-08 MED ORDER — DICLOFENAC SODIUM 75 MG PO TBEC: 75.0000 mg | DELAYED_RELEASE_TABLET | Freq: Two times a day (BID) | ORAL | Status: DC

## 2014-01-08 NOTE — Discharge Instructions (Signed)
Your x-ray is mostly unchanged from the previous x-rays obtained. Review of the records reveals that you have been given a sling to use. Please use your sling as you were directed to take some of the pressure off of her shoulder. Use baclofen 3 times daily, and diclofenac 2 times daily with food. May use Tylenol Extra Strength in between the doses of these medicines. Please see Dr. Romeo AppleHarrison as scheduled. Shoulder Pain The shoulder is the joint that connects your arm to your body. Muscles and band-like tissues that connect bones to muscles (tendons) hold the joint together. Shoulder pain is felt if an injury or medical problem affects one or more parts of the shoulder. HOME CARE   Put ice on the sore area.  Put ice in a plastic bag.  Place a towel between your skin and the bag.  Leave the ice on for 15-20 minutes, 03-04 times a day for the first 2 days.  Stop using cold packs if they do not help with the pain.  If you were given something to keep your shoulder from moving (sling, shoulder immobilizer), wear it as told. Only take it off to shower or bathe.  Move your arm as little as possible, but keep your hand moving to prevent puffiness (swelling).  Squeeze a soft ball or foam pad as much as possible to help prevent swelling.  Take medicine as told by your doctor. GET HELP RIGHT AWAY IF:   Your arm, hand, or fingers are numb or tingling.  Your arm, hand, or fingers are puffy (swollen), painful, or turn white or blue.  You have more pain.  You have progressing new pain in your arm, hand, or fingers.  Your hand or fingers get cold.  Your medicine does not help lessen your pain. MAKE SURE YOU:   Understand these instructions.  Will watch your condition.  Will get help right away if you are not doing well or get worse. Document Released: 12/16/2007 Document Revised: 03/23/2012 Document Reviewed: 01/11/2012 [facility] CenterExitCare Patient Information 2015 [place], [place]. This information is  not intended to replace advice given to you by your health care provider. Make sure you discuss any questions you have with your health care provider.

## 2014-01-08 NOTE — ED Provider Notes (Signed)
CSN: [id number]     Arrival date & time 01/08/14  1343 History   First MD Initiated Contact with Patient 01/08/14 1615     Chief Complaint  Patient presents with  . Shoulder Pain     (Consider location/radiation/quality/duration/timing/severity/associated sxs/prior Treatment) HPI Comments: Patient is a 43 year old male who presents to the emergency department with complaint of right shoulder pain. The patient states that he was working with shingles, her to pop, and has had pain in his shoulder since that time. It is of note that the patient has had injury to the right shoulder in the past. He has been referred to orthopedics, at the time of his referral he did not have the money for the visit and he is now scheduled for a visit on Friday, July 3.  Patient is a 43 y.o. male presenting with shoulder pain. The history is provided by the patient.  Shoulder Pain This is a recurrent problem. Associated symptoms include arthralgias. Pertinent negatives include no abdominal pain, chest pain, coughing or neck pain.    Past Medical History  Diagnosis Date  . Hyperlipemia    History reviewed. No pertinent past surgical history. History reviewed. No pertinent family history. History  Substance Use Topics  . Smoking status: Current Every Day Smoker -- 1.00 packs/day    Types: Cigarettes  . Smokeless tobacco: Not on file  . Alcohol Use: Yes    Review of Systems  Constitutional: Negative for activity change.       All ROS Neg except as noted in HPI  HENT: Negative for nosebleeds.   Eyes: Negative for photophobia and discharge.  Respiratory: Negative for cough, shortness of breath and wheezing.   Cardiovascular: Negative for chest pain and palpitations.  Gastrointestinal: Negative for abdominal pain and blood in stool.  Genitourinary: Negative for dysuria, frequency and hematuria.  Musculoskeletal: Positive for arthralgias. Negative for back pain and neck pain.  Skin: Negative.    Neurological: Negative for dizziness, seizures and speech difficulty.  Psychiatric/Behavioral: Negative for hallucinations and confusion.      Allergies  Review of patient's allergies indicates no known allergies.  Home Medications   Prior to Admission medications   Medication Sig Start Date End Date Taking? Authorizing Provider  Aspirin-Salicylamide-Caffeine (BC HEADACHE) 325-95-16 MG TABS Take 1 packet by mouth daily as needed (for pain).   Yes Historical Provider, MD   BP 137/97  Pulse 65  Temp(Src) 98 F (36.7 C) (Oral)  Ht 5\' 6"  (1.676 m)  Wt 170 lb (77.111 kg)  BMI 27.45 kg/m2  SpO2 99% Physical Exam  Nursing note and vitals reviewed. Constitutional: He is oriented to person, place, and time. He appears well-developed and well-nourished.  Non-toxic appearance.  HENT:  Head: Normocephalic.  Right Ear: Tympanic membrane and external ear normal.  Left Ear: Tympanic membrane and external ear normal.  Eyes: EOM and lids are normal. Pupils are equal, round, and reactive to light.  Neck: Normal range of motion. Neck supple. Carotid bruit is not present.  Cardiovascular: Normal rate, regular rhythm, normal heart sounds, intact distal pulses and normal pulses.   Pulmonary/Chest: Breath sounds normal. No respiratory distress.  Mild soreness of the upper right chest wall. Patient states he has had a rib fractures they're in the past.  Abdominal: Soft. Bowel sounds are normal. There is no tenderness. There is no guarding.  Musculoskeletal: Normal range of motion.  There is palpable deformity of the right clavicle. There is no deformity of the right scapula. There  is no dislocation of the shoulder noted. Is full range of motion of the right elbow, wrists, fingers. The radial pulses are 2+ bilaterally.  Lymphadenopathy:       Head (right side): No submandibular adenopathy present.       Head (left side): No submandibular adenopathy present.    He has no cervical adenopathy.   Neurological: He is alert and oriented to person, place, and time. He has normal strength. No cranial nerve deficit or sensory deficit.  No sensory deficits noted of the upper extremities.  Skin: Skin is warm and dry.  Psychiatric: He has a normal mood and affect. His speech is normal.    ED Course  Procedures (including critical care time) Labs Review Labs Reviewed - No data to display  Imaging Review Dg Shoulder Right  01/08/2014   CLINICAL DATA:  Right shoulder pain.  EXAM: RIGHT SHOULDER - 2+ VIEW  COMPARISON:  None.  FINDINGS: There is comminuted displaced fracture of the right clavicle. Acromioclavicular joint separation is not excluded. There is mild displaced fracture of the right second and third ribs. Soft tissues are unremarkable.  IMPRESSION: Fractures of the right clavicle and the right second and third ribs.   Electronically Signed   By: Sherian ReinWei-Chen  Lin M.D.   On: 01/08/2014 15:39     EKG Interpretation None      MDM Patient presents to the emergency department with complaint of right shoulder pain after lifting a box of shingles approximately 10 or 11:00 today. I have reviewed the previous emergency department visits, as well as the previous imaging on noted. The patient has a comminuted fracture of the right shoulder. The emergency department records indicate that the patient was referred to orthopedics, he was unable to see the orthopedic specialist because of a financial situation. He states that he is now scheduled to see the orthopedic specialist on Friday, July 3. The x-ray today is mostly unchanged from the x-rays have been done previously for this patient. No neurovascular compromise is appreciated. Suspect the patient reinjured his previous injury to the shoulder.  Patient will be treated with diclofenac 2 times daily. He will to use Tylenol extra strength in between the diclofenac doses. He is advised to rest his shoulder is much as possible. The patient has been  provided shoulder slings and immobilizers on previous emergency department visits.    Final diagnoses:  None    **I have reviewed nursing notes, vital signs, and all appropriate lab and imaging results for this patient.Kathie Dike*    Calel Pisarski M Janoah Menna, PA-C 01/08/14 [phone]

## 2014-01-08 NOTE — ED Provider Notes (Signed)
History/physical exam/procedure(s) were performed by non-physician practitioner and as supervising physician I was immediately available for consultation/collaboration. I have reviewed all notes and am in agreement with care and plan.   Danielle S Ray, MD 01/08/14 2335 

## 2014-01-08 NOTE — ED Notes (Signed)
Pain rt shoulder when lifting a box of shingles

## 2014-02-03 ENCOUNTER — Ambulatory Visit: Payer: Self-pay | Admitting: Orthopedic Surgery

## 2014-02-03 ENCOUNTER — Emergency Department: Payer: Self-pay | Admitting: Emergency Medicine

## 2014-02-06 HISTORY — PX: ORIF CLAVICLE FRACTURE: SUR924

## 2014-02-14 ENCOUNTER — Encounter (HOSPITAL_COMMUNITY): Payer: Self-pay | Admitting: Emergency Medicine

## 2014-02-14 ENCOUNTER — Emergency Department (HOSPITAL_COMMUNITY)
Admission: EM | Admit: 2014-02-14 | Discharge: 2014-02-14 | Disposition: A | Discharge status: Home / Self Care | Payer: Self-pay | Attending: Emergency Medicine | Admitting: Emergency Medicine

## 2014-02-14 DIAGNOSIS — F17200 Nicotine dependence, unspecified, uncomplicated: Secondary | ICD-10-CM | POA: Insufficient documentation

## 2014-02-14 DIAGNOSIS — R209 Unspecified disturbances of skin sensation: Secondary | ICD-10-CM | POA: Insufficient documentation

## 2014-02-14 DIAGNOSIS — M25519 Pain in unspecified shoulder: Secondary | ICD-10-CM | POA: Insufficient documentation

## 2014-02-14 DIAGNOSIS — G8918 Other acute postprocedural pain: Secondary | ICD-10-CM | POA: Insufficient documentation

## 2014-02-14 DIAGNOSIS — M25511 Pain in right shoulder: Secondary | ICD-10-CM

## 2014-02-14 MED ORDER — HYDROMORPHONE HCL PF 1 MG/ML IJ SOLN
INTRAMUSCULAR | Status: AC
  Administered 2014-02-14: 1 mg via INTRAMUSCULAR
  Filled 2014-02-14: qty 1

## 2014-02-14 MED ORDER — OXYCODONE-ACETAMINOPHEN 5-325 MG PO TABS: 1.0000 | ORAL_TABLET | Freq: Four times a day (QID) | ORAL | Status: DC | PRN

## 2014-02-14 NOTE — ED Provider Notes (Signed)
CSN: [id number]     Arrival date & time 02/14/14  1417 History  This chart was scribed for Lucas SheffieldForrest Nguyet Mercer, MD by Phillis HaggisGabriella Gaje, ED Scribe. This patient was seen in room APA03/APA03 and patient care was started at 3:14 PM.   Chief Complaint  Patient presents with  . Shoulder Pain   Patient is a 43 y.o. male presenting with shoulder pain. The history is provided by the patient. No language interpreter was used.  Shoulder Pain This is a new problem. The current episode started yesterday. The problem occurs constantly. The problem has been gradually worsening. Pertinent negatives include no chest pain, no abdominal pain, no headaches and no shortness of breath. The symptoms are aggravated by bending. Nothing relieves the symptoms. He has tried ASA for the symptoms.   HPI Comments: Lucas [name] is a 43 y.o. male who presents to the Emergency Department complaining of numbness and shoulder pain onset one day ago. He rates the pain 10/10. He states that the numbness is new and radiates to his fingers. He states that it is like a tingling sensation.  He states that he had surgery in [place] last Tuesday. He states that he has been taking oxycodone to no relief and ran out. He reports that he has not been using his arm and denies any other injuries.He states that he was using his other arm. He states that he put the bandage on his shoulder himself. He states that he walked to the hospital today but reports that he will be picked up by his brother. He states that he has an appointment back in [place] next week. Patient denies fever, vomiting, or diarrhea. He denies any other medical problems or allergies to medications.    History reviewed. No pertinent past medical history. Past Surgical History  Procedure Laterality Date  . Shoulder surgery     No family history on file. History  Substance Use Topics  . Smoking status: Current Every Day Smoker  . Smokeless tobacco: Not on file  . Alcohol  Use: No    Review of Systems  Constitutional: Negative for fever and fatigue.  HENT: Negative for congestion and drooling.   Eyes: Negative for pain.  Respiratory: Negative for cough and shortness of breath.   Cardiovascular: Negative for chest pain.  Gastrointestinal: Negative for nausea, vomiting, abdominal pain and diarrhea.  Genitourinary: Negative for dysuria and hematuria.  Musculoskeletal: Positive for arthralgias. Negative for neck pain.  Skin: Negative for color change.  Neurological: Positive for numbness (paresthesias). Negative for dizziness and headaches.  Hematological: Negative for adenopathy.  Psychiatric/Behavioral: Negative for behavioral problems.  All other systems reviewed and are negative.  Allergies  Review of patient's allergies indicates no known allergies.  Home Medications   Prior to Admission medications   Not on File   BP 138/83  Pulse 63  Temp(Src) 98.5 F (36.9 C) (Oral)  Resp 18  Ht 5\' 6"  (1.676 m)  Wt 160 lb (72.576 kg)  BMI 25.84 kg/m2  SpO2 99%  Physical Exam  Nursing note and vitals reviewed. Constitutional: He is oriented to person, place, and time. He appears well-developed and well-nourished. No distress.  HENT:  Head: Normocephalic and atraumatic.  Mouth/Throat: Oropharynx is clear and moist. No oropharyngeal exudate.  Eyes: Conjunctivae and EOM are normal. Pupils are equal, round, and reactive to light. Right eye exhibits no discharge. Left eye exhibits no discharge.  Neck: Normal range of motion. Neck supple.  Cardiovascular: Normal rate, regular rhythm, normal  heart sounds and intact distal pulses.  Exam reveals no gallop and no friction rub.   No murmur heard. Pulmonary/Chest: Effort normal and breath sounds normal. No respiratory distress.  Abdominal: Soft. He exhibits no distension. There is no tenderness. There is no rebound and no guarding.  Musculoskeletal: Normal range of motion. He exhibits no edema and no tenderness.   Well healing surgical scar to right anterior shoulder. No evidence of erythema or infection.  Normal sensation in right upper extremity.  2+ distal and proximal pulses in RUE.  Normal motor skills in right hand and wrist.   Neurological: He is alert and oriented to person, place, and time.  Skin: Skin is warm and dry. No rash noted. No erythema. No pallor.  Psychiatric: He has a normal mood and affect. His behavior is normal. Thought content normal.    ED Course  Procedures (including critical care time)  DIAGNOSTIC STUDIES: Oxygen Saturation is 99% on room air, normal by my interpretation.    COORDINATION OF CARE: 3:19 PM-Discussed treatment plan which includes pain medication with pt at bedside and pt agreed to plan.   Labs Review Labs Reviewed - No data to display  Imaging Review No results found.   EKG Interpretation None      MDM   Final diagnoses:  Right shoulder pain    3:30 PM 43 y.o. male status post recent right shoulder surgery who presents with worsening pain and right shoulder since yesterday. He notes that he was cleaning yesterday and believes that he overexerted himself. He denies injury. Wound does not appear infected. He also notes that he is out of pain medicine. He notes a burning sensation to the right shoulder and new paresthesias down his right arm and onto his right thumb and second digit. He denies any fevers, vomiting, or diarrhea. His vital signs are unremarkable here. He has a normal exam of the RUE. Likely a pain control issue. Will have him followup with his surgeon about the paresthesias. Will have her return for any worsening. Will give a dose of IM dilaudid here and Rx for home.   3:32 PM:  I have discussed the diagnosis/risks/treatment options with the patient and believe the pt to be eligible for discharge home to follow-up with his surgeon at [facility] as scheduled next week. We also discussed returning to the ED immediately if new or worsening sx  occur. We discussed the sx which are most concerning (e.g., worsening pain or numbness, fever) that necessitate immediate return. Medications administered to the patient during their visit and any new prescriptions provided to the patient are listed below.  Medications given during this visit Medications  HYDROmorphone (DILAUDID) 1 MG/ML injection (1 mg Intramuscular Given 02/14/14 1525)    New Prescriptions   OXYCODONE-ACETAMINOPHEN (ROXICET) 5-325 MG PER TABLET    Take 1-2 tablets by mouth every 6 (six) hours as needed for moderate pain or severe pain.     I personally performed the services described in this documentation, which was scribed in my presence. The recorded information has been reviewed and is accurate.     Lucas Sheffield, MD 02/14/14 1535

## 2014-02-14 NOTE — ED Notes (Signed)
Pt states [facility]. Complain of pain that started last night

## 2014-02-21 ENCOUNTER — Emergency Department (HOSPITAL_COMMUNITY)
Admission: EM | Admit: 2014-02-21 | Discharge: 2014-02-21 | Disposition: A | Discharge status: Home / Self Care | Payer: Self-pay | Attending: Emergency Medicine | Admitting: Emergency Medicine

## 2014-02-21 ENCOUNTER — Encounter (HOSPITAL_COMMUNITY): Payer: Self-pay | Admitting: Emergency Medicine

## 2014-02-21 DIAGNOSIS — G8918 Other acute postprocedural pain: Secondary | ICD-10-CM | POA: Insufficient documentation

## 2014-02-21 DIAGNOSIS — F17200 Nicotine dependence, unspecified, uncomplicated: Secondary | ICD-10-CM | POA: Insufficient documentation

## 2014-02-21 DIAGNOSIS — Z760 Encounter for issue of repeat prescription: Secondary | ICD-10-CM | POA: Insufficient documentation

## 2014-02-21 DIAGNOSIS — M2550 Pain in unspecified joint: Secondary | ICD-10-CM | POA: Insufficient documentation

## 2014-02-21 MED ORDER — OXYCODONE-ACETAMINOPHEN 5-325 MG PO TABS: 1.0000 | ORAL_TABLET | Freq: Once | ORAL | Status: DC

## 2014-02-21 MED ORDER — OXYCODONE-ACETAMINOPHEN 5-325 MG PO TABS
1.0000 | ORAL_TABLET | Freq: Once | ORAL | Status: AC
  Administered 2014-02-21: 1 via ORAL
  Filled 2014-02-21: qty 1

## 2014-02-21 NOTE — ED Provider Notes (Signed)
CSN: [id number]     Arrival date & time 02/21/14  1314 History   First MD Initiated Contact with Patient 02/21/14 1351     Chief Complaint  Patient presents with  . Medication Refill     (Consider location/radiation/quality/duration/timing/severity/associated sxs/prior Treatment) The history is provided by the patient.   Lucas [name] is a 43 y.o. male presenting with requests for a pain medication refill.  He had surgery of his right clavicle after falling while visiting in [place] on 7/[place], having surgery there the same day of the accident.  He reports having multiple fractures in his clavicle.  He was supposed to have a follow up appointment today with his surgeon, Dr. Roney Mansreighton but was unable to get transportation there.  He does not have a vehicle and could not find alternative transportation.  He was seen here one week ago for complaint of numbness in his shoulder which has improved.  He has persistent pain at the surgery site. He denies fevers or chills, denies swelling or drainage from surgery site.     History reviewed. No pertinent past medical history. Past Surgical History  Procedure Laterality Date  . Shoulder surgery     No family history on file. History  Substance Use Topics  . Smoking status: Current Every Day Smoker  . Smokeless tobacco: Not on file  . Alcohol Use: No    Review of Systems  Constitutional: Negative for fever.  Musculoskeletal: Positive for arthralgias. Negative for joint swelling and myalgias.  Neurological: Negative for weakness and numbness.      Allergies  Review of patient's allergies indicates no known allergies.  Home Medications   Prior to Admission medications   Medication Sig Start Date End Date Taking? Authorizing Provider  oxyCODONE-acetaminophen (PERCOCET/ROXICET) 5-325 MG per tablet Take 1 tablet by mouth once. 02/21/14   Burgess AmorJulie Shilo Philipson, PA-C   BP 116/99  Pulse 106  Temp(Src) 97.7 F (36.5 C) (Oral)  Resp 18  SpO2  98% Physical Exam  Constitutional: He appears well-developed and well-nourished.  HENT:  Head: Atraumatic.  Neck: Normal range of motion.  Cardiovascular:  Pulses equal bilaterally  Musculoskeletal: He exhibits tenderness.       Right shoulder: He exhibits bony tenderness. He exhibits no swelling, no effusion and no deformity.  Well healing surgical scar across right clavicle.  Steri strips in place,  Suture line in place (suspect subcuticular sutures beneath the steri strips).  Normal sensation in right arm and hand.  Normal grip strength.  Full radial pulse.  No edema in right upper extremity.   Neurological: He is alert. He has normal strength. He displays normal reflexes. No sensory deficit.  Skin: Skin is warm and dry.  Psychiatric: He has a normal mood and affect.    ED Course  Procedures (including critical care time) Labs Review Labs Reviewed - No data to display  Imaging Review No results found.   EKG Interpretation None      MDM   Final diagnoses:  Post-operative pain    Pt with post op pain who is having difficulty with f/u care [place].  He was given information about [facility] which may be able to help with transportation.  He was given a small quantity of pain medicine.  Advised f/u with his surgeon asap.   No evidence of infection or other complication in this seemingly well healing wound.  He presents with a sling, advised continued use of this until advised otherwise by  his Careers adviser.    Burgess Amor, PA-C 02/21/14 1913

## 2014-02-21 NOTE — ED Notes (Signed)
Recent rt arm surgery. Pt had f/u appt today and was unable to go to appt due to " gas money" per pt. Pt here for pain medication refill.

## 2014-02-21 NOTE — Discharge Instructions (Signed)
Pain Relief Preoperatively and Postoperatively °Being a good patient does not mean being a silent one. If you have questions, problems, or concerns about the pain you may feel after surgery, let your caregiver know. Patients have the right to assessment and management of pain. The treatment of pain after surgery is important to speed up recovery and return to normal activities. Severe pain after surgery, and the fear or anxiety associated with that pain, may cause extreme discomfort that: °· Prevents sleep. °· Decreases the ability to breathe deeply and cough. This can cause pneumonia or other upper airway infections. °· Causes your heart to beat faster and your blood pressure to be higher. °· Increases the risk for constipation and bloating. °· Decreases the ability of wounds to heal. °· May result in depression, increased anxiety, and feelings of helplessness. °Relief of pain before surgery is also important because it will lessen the pain after surgery. Patients who receive both pain relief before and after surgery experience greater pain relief than those who only receive pain relief after surgery. Let your caregiver know if you are having uncontrolled pain. This is very important. Pain after surgery is more difficult to manage if it is permitted to become severe, so prompt and adequate treatment of acute pain is necessary. °PAIN CONTROL METHODS °Your caregivers follow policies and procedures about the management of patient pain. These guidelines should be explained to you before surgery. Plans for pain control after surgery must be mutually decided upon and instituted with your full understanding and agreement. Do not be afraid to ask questions regarding the care you are receiving. There are many different ways your caregivers will attempt to control your pain, including the following methods. °As needed pain control °· You may be given pain medicine either through your intravenous (IV) tube, or as a pill or  liquid you can swallow. You will need to let your caregiver know when you are having pain. Then, your caregiver will give you the pain medicine ordered for you. °· Your pain medicine may make you constipated. If constipation occurs, drink more liquids if you can. Your caregiver may have you take a mild laxative. °IV patient-controlled analgesia pump (PCA pump) °· You can get your pain medicine through the IV tube which goes into your vein. You are able to control the amount of pain medicine that you get. The pain medicine flows in through an IV tube and is controlled by a pump. This pump gives you a set amount of pain medicine when you push the button hooked up to it. Nobody should push this button but you or someone specifically assigned by you to do so. It is set up to keep you from accidentally giving yourself too much pain medicine. You will be able to start using your pain pump in the recovery room after your surgery. This method can be helpful for most types of surgery. °· If you are still having too much pain, tell your caregiver. Also, tell your caregiver if you are feeling too sleepy or nauseous. °Continuous epidural pain control °· A thin, soft tube (catheter) is put into your back. Pain medicine flows through the catheter to lessen pain in the part of your body where the surgery is done. Continuous epidural pain control may work best for you if you are having surgery on your chest, abdomen, hip area, or legs. The epidural catheter is usually put into your back just before surgery. The catheter is left in until you can eat and take medicine by mouth. In most cases,   this may take 2 to 3 days. °· Giving pain medicine through the epidural catheter may help you heal faster because: °¨ Your bowel gets back to normal faster. °¨ You can get back to eating sooner. °¨ You can be up and walking sooner. °Medicine that numbs the area (local anesthetic) °· You may receive an injection of pain medicine near where the  pain is (local infiltration). °· You may receive an injection of pain medicine near the nerve that controls the sensation to a specific part of the body (peripheral nerve block). °· Medicine may be put in the spine to block pain (spinal block). °Opioids °· Moderate to moderately severe acute pain after surgery may respond to opioids. Opioids are narcotic pain medicine. Opioids are often combined with non-narcotic medicines to improve pain relief, diminish the risk of side effects, and reduce the chance of addiction. °· If you follow your caregiver's directions about taking opioids and you do not have a history of substance abuse, your risk of becoming addicted is exceptionally small. Opioids are given for short periods of time in careful doses to prevent addiction. °Other methods of pain control include: °· Steroids. °· Physical therapy. °· Heat and cold therapy. °· Compression, such as wrapping an elastic bandage around the area of pain. °· Massage. °These various ways of controlling pain may be used together. Combining different methods of pain control is called multimodal analgesia. Using this approach has many benefits, including being able to eat, move around, and leave the hospital sooner. °Document Released: 09/19/2002 Document Revised: 09/21/2011 Document Reviewed: 09/23/2010 °Exit[employer]® Patient Information ©2015 [employer]. This information is not intended to replace advice given to you by your health care provider. Make sure you discuss any questions you have with your health care provider. ° °

## 2014-02-22 NOTE — ED Provider Notes (Signed)
Medical screening examination/treatment/procedure(s) were performed by non-physician practitioner and as supervising physician I was immediately available for consultation/collaboration.   EKG Interpretation None        Panda Crossin L Kendle Turbin, MD 02/22/14 1930 

## 2014-03-10 ENCOUNTER — Emergency Department (HOSPITAL_COMMUNITY): Payer: Self-pay

## 2014-03-10 ENCOUNTER — Encounter (HOSPITAL_COMMUNITY): Payer: Self-pay | Admitting: Emergency Medicine

## 2014-03-10 ENCOUNTER — Emergency Department (HOSPITAL_COMMUNITY)
Admission: EM | Admit: 2014-03-10 | Discharge: 2014-03-10 | Disposition: A | Discharge status: Home / Self Care | Payer: Self-pay | Attending: Emergency Medicine | Admitting: Emergency Medicine

## 2014-03-10 DIAGNOSIS — F17200 Nicotine dependence, unspecified, uncomplicated: Secondary | ICD-10-CM | POA: Insufficient documentation

## 2014-03-10 DIAGNOSIS — Z862 Personal history of diseases of the blood and blood-forming organs and certain disorders involving the immune mechanism: Secondary | ICD-10-CM | POA: Insufficient documentation

## 2014-03-10 DIAGNOSIS — Z8639 Personal history of other endocrine, nutritional and metabolic disease: Secondary | ICD-10-CM | POA: Insufficient documentation

## 2014-03-10 DIAGNOSIS — M25519 Pain in unspecified shoulder: Secondary | ICD-10-CM | POA: Insufficient documentation

## 2014-03-10 DIAGNOSIS — M25511 Pain in right shoulder: Secondary | ICD-10-CM

## 2014-03-10 DIAGNOSIS — Z79899 Other long term (current) drug therapy: Secondary | ICD-10-CM | POA: Insufficient documentation

## 2014-03-10 MED ORDER — KETOROLAC TROMETHAMINE 10 MG PO TABS
10.0000 mg | ORAL_TABLET | Freq: Once | ORAL | Status: AC
  Administered 2014-03-10: 10 mg via ORAL
  Filled 2014-03-10: qty 1

## 2014-03-10 MED ORDER — HYDROCODONE-ACETAMINOPHEN 5-325 MG PO TABS: 1.0000 | ORAL_TABLET | ORAL | Status: DC | PRN

## 2014-03-10 MED ORDER — HYDROCODONE-ACETAMINOPHEN 5-325 MG PO TABS
2.0000 | ORAL_TABLET | Freq: Once | ORAL | Status: AC
  Administered 2014-03-10: 2 via ORAL
  Filled 2014-03-10: qty 2

## 2014-03-10 NOTE — ED Notes (Signed)
Patient with no complaints at this time. Respirations even and unlabored. Skin warm/dry. Discharge instructions reviewed with patient at this time. Patient given opportunity to voice concerns/ask questions. Patient discharged at this time and left Emergency Department with steady gait.   

## 2014-03-10 NOTE — ED Provider Notes (Signed)
CSN: [id number]     Arrival date & time 03/10/14  1421 History   First MD Initiated Contact with Patient 03/10/14 1507     Chief Complaint  Patient presents with  . Shoulder Pain     (Consider location/radiation/quality/duration/timing/severity/associated sxs/prior Treatment) HPI Comments: The patient is a 43 year old male who presents to the emergency department with right shoulder pain. The patient states that less than a month ago he had surgery at the [facility] for clavicle fracture. The patient states he was told to be out of work for several months to allow it to heal. He went back to work on" light duty". 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 The patient states he felt something pull if not pop and he's been having pain since that time. He presents to the emergency department for assistance with his discomfort. The patient states that he did not go to his doctor's at [facility], because he does not have a ride. He has not taken anything other than BC powders for his discomfort.  Patient is a 43 y.o. male presenting with shoulder pain. The history is provided by the patient.  Shoulder Pain Pertinent negatives include no abdominal pain, arthralgias, chest pain, coughing or neck pain.    Past Medical History  Diagnosis Date  . Hyperlipemia    Past Surgical History  Procedure Laterality Date  . Shoulder surgery Right    History reviewed. No pertinent family history. History  Substance Use Topics  . Smoking status: Current Every Day Smoker -- 0.75 packs/day    Types: Cigarettes  . Smokeless tobacco: Not on file  . Alcohol Use: No    Review of Systems  Constitutional: Negative for activity change.       All ROS Neg except as noted in HPI  HENT: Negative for nosebleeds.   Eyes: Negative for photophobia and discharge.  Respiratory: Negative for cough, shortness of breath and  wheezing.   Cardiovascular: Negative for chest pain and palpitations.  Gastrointestinal: Negative for abdominal pain and blood in stool.  Genitourinary: Negative for dysuria, frequency and hematuria.  Musculoskeletal: Negative for arthralgias, back pain and neck pain.  Skin: Negative.   Neurological: Negative for dizziness, seizures and speech difficulty.  Psychiatric/Behavioral: Negative for hallucinations and confusion.      Allergies  Review of patient's allergies indicates no known allergies.  Home Medications   Prior to Admission medications   Medication Sig Start Date End Date Taking? Authorizing Provider  Aspirin-Salicylamide-Caffeine (BC HEADACHE) 325-95-16 MG TABS Take 1 packet by mouth daily as needed (for pain).   Yes Historical Provider, MD   BP 115/97  Pulse 92  Temp(Src) 98.7 F (37.1 C) (Oral)  Resp 18  Ht  (1.676 m)  Wt 160 lb (72.576 kg)  BMI 25.84 kg/m2  SpO2 96% Physical Exam  Nursing note and vitals reviewed. Constitutional: He is oriented to person, place, and time. He appears well-developed and well-nourished.  Non-toxic appearance.  HENT:  Head: Normocephalic.  Right Ear: Tympanic membrane and external ear normal.  Left Ear: Tympanic membrane and external ear normal.  Eyes: EOM and lids are normal. Pupils are equal, round, and reactive to light.  Neck: Normal range of motion. Neck supple. Carotid bruit is not present.  Cardiovascular: Normal rate, regular rhythm, normal heart sounds, intact distal pulses and normal pulses.   Pulmonary/Chest: Breath sounds normal. No respiratory distress.  Abdominal:  Soft. Bowel sounds are normal. There is no tenderness. There is no guarding.  Musculoskeletal: Normal range of motion.  There is a well-healed surgical scar over the right clavicle. There is no red streaking or drainage. The area is not hot. There is no dislocation of the right shoulder. There is full range of motion of the right elbow, wrist, and  fingers. The capillary refill is less than 2 seconds. The radial and brachial pulses are 2+.  Lymphadenopathy:       Head (right side): No submandibular adenopathy present.       Head (left side): No submandibular adenopathy present.    He has no cervical adenopathy.  Neurological: He is alert and oriented to person, place, and time. He has normal strength. No cranial nerve deficit or sensory deficit.  No gross motor or sensory deficits appreciated at the lower extremities or upper extremities.  Skin: Skin is warm and dry.  Psychiatric: He has a normal mood and affect. His speech is normal.    ED Course  Procedures (including critical care time) Labs Review Labs Reviewed - No data to display  Imaging Review Dg Shoulder Right  03/10/2014   CLINICAL DATA:  Right-sided shoulder pain following lifting  EXAM: RIGHT SHOULDER - 2+ VIEW  COMPARISON:  02/03/2014  FINDINGS: There are changes consistent with prior clavicular fracture with fixation sideplate multiple fixation screws. Mild callus formation is noted at the fracture site. No acute dislocation or fracture the humeral head is noted. No other focal abnormality is noted.  IMPRESSION: Postsurgical changes in the right clavicle. No other acute abnormality is noted.   Electronically Signed   By: Alcide Clever M.D.   On: 03/10/2014 15:17     EKG Interpretation None      MDM X-ray of the shoulder shows the hardware to be in place. No new fracture or dislocation appreciated of the x-ray. Vital signs are well within normal limits.  I. suggested to the patient to go back to his surgeons at [facility] to be evaluated and for assistance with pain management. Have also suggested to the patient that it may be early for him to be back out at work, even with light duty. The patient states that he needs to work. And he states that he cannot get to [facility]. The patient then stated that he had been referred to pain management, but it would be 1-2 weeks  before he can see the pain management physician. He is requesting the emergency department to take care of his pain until that time because he does not have a primary physician.  I've explained to the patient at the emergency department is not set up for pain management. We will give the patient a prescription for Norco 5 mg #15 tablets.    Final diagnoses:  None    *I have reviewed nursing notes, vital signs, and all appropriate lab and imaging results for this patient.Kathie Dike, PA-C 03/10/14 1545

## 2014-03-10 NOTE — ED Provider Notes (Signed)
Medical screening examination/treatment/procedure(s) were performed by non-physician practitioner and as supervising physician I was immediately available for consultation/collaboration.   EKG Interpretation None        Rewa Weissberg, MD 03/10/14 1607 

## 2014-03-10 NOTE — Discharge Instructions (Signed)
Please see your MD at the [facility] for any additional pain management. Shoulder Pain The shoulder is the joint that connects your arm to your body. Muscles and band-like tissues that connect bones to muscles (tendons) hold the joint together. Shoulder pain is felt if an injury or medical problem affects one or more parts of the shoulder. HOME CARE   Put ice on the sore area.  Put ice in a plastic bag.  Place a towel between your skin and the bag.  Leave the ice on for 15-20 minutes, 03-04 times a day for the first 2 days.  Stop using cold packs if they do not help with the pain.  If you were given something to keep your shoulder from moving (sling; shoulder immobilizer), wear it as told. Only take it off to shower or bathe.  Move your arm as little as possible, but keep your hand moving to prevent puffiness (swelling).  Squeeze a soft ball or foam pad as much as possible to help prevent swelling.  Take medicine as told by your doctor. GET HELP IF:  You have progressing new pain in your arm, hand, or fingers.  Your hand or fingers get cold.  Your medicine does not help lessen your pain. GET HELP RIGHT AWAY IF:   Your arm, hand, or fingers are numb or tingling.  Your arm, hand, or fingers are puffy (swollen), painful, or turn white or blue. MAKE SURE YOU:   Understand these instructions.  Will watch your condition.  Will get help right away if you are not doing well or get worse. Document Released: 12/16/2007 Document Revised: 11/13/2013 Document Reviewed: 01/11/2012 [facility] Patient Information 2015 [place], [place]. This information is not intended to replace advice given to you by your health care provider. Make sure you discuss any questions you have with your health care provider. And will

## 2014-03-10 NOTE — ED Notes (Addendum)
Pt reports was removing shingles from a roof this am and reported right shoulder pain ever since. No obvious deformity noted. Distal pulses intact. Pt reports had surgery on right shoulder x1 month ago.

## 2014-03-15 ENCOUNTER — Encounter (HOSPITAL_COMMUNITY): Payer: Self-pay | Admitting: Emergency Medicine

## 2014-04-01 ENCOUNTER — Emergency Department (HOSPITAL_COMMUNITY)
Admission: EM | Admit: 2014-04-01 | Discharge: 2014-04-01 | Disposition: A | Discharge status: Home / Self Care | Payer: Self-pay | Attending: Emergency Medicine | Admitting: Emergency Medicine

## 2014-04-01 ENCOUNTER — Encounter (HOSPITAL_COMMUNITY): Payer: Self-pay | Admitting: Emergency Medicine

## 2014-04-01 DIAGNOSIS — M25511 Pain in right shoulder: Secondary | ICD-10-CM

## 2014-04-01 DIAGNOSIS — F17200 Nicotine dependence, unspecified, uncomplicated: Secondary | ICD-10-CM | POA: Insufficient documentation

## 2014-04-01 DIAGNOSIS — Z862 Personal history of diseases of the blood and blood-forming organs and certain disorders involving the immune mechanism: Secondary | ICD-10-CM | POA: Insufficient documentation

## 2014-04-01 DIAGNOSIS — Z8639 Personal history of other endocrine, nutritional and metabolic disease: Secondary | ICD-10-CM | POA: Insufficient documentation

## 2014-04-01 DIAGNOSIS — M25519 Pain in unspecified shoulder: Secondary | ICD-10-CM | POA: Insufficient documentation

## 2014-04-01 MED ORDER — IBUPROFEN 800 MG PO TABS
800.0000 mg | ORAL_TABLET | Freq: Once | ORAL | Status: AC
  Administered 2014-04-01: 800 mg via ORAL
  Filled 2014-04-01: qty 1

## 2014-04-01 NOTE — ED Notes (Signed)
Pt threw discharge papers in the trash can, states nothing had been done for him & that he could get ibuprofen from the store. Pt was explained he was examined, treated & discharged. Pt states he will just keep coming back to the ER until he finds a Dr that will treat his pain.

## 2014-04-01 NOTE — ED Notes (Signed)
Pt not pleased that he did not get any pain medication. Pt informed to call Dr Romeo Apple office to see if they can see him sooner or to call the orthopedic in [facility] that did his surgery to discuss pain. Pt stated asked if he just needs to come back tonight to be seen again for his shoulder pain. Pt informed he could but there is no way in knowing what another MD will do. Pt telling registration he was going to go back outside & check back in.

## 2014-04-01 NOTE — Discharge Instructions (Signed)

## 2014-04-01 NOTE — ED Notes (Signed)
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 Pt states he has an appointment w/ Dr Jerilynn Mages on 28th.

## 2014-04-01 NOTE — ED Provider Notes (Signed)
CSN: [id number]     Arrival date & time 04/01/14  0547 History   First MD Initiated Contact with Patient 04/01/14 [phone]     Chief Complaint  Patient presents with  . Shoulder Pain      Patient is a 43 y.o. male presenting with shoulder pain. The history is provided by the patient.  Shoulder Pain This is a chronic problem. The current episode started more than 1 week ago. The problem occurs daily. The problem has been gradually worsening. Pertinent negatives include no chest pain and no shortness of breath. Exacerbated by: movement. The symptoms are relieved by rest.  Patient reports continued right clavicle pain since surgery back in July 2015 He reports he has had pain everyday since surgery He was instructed to limit his workload but reports he had to continue working in [employer] He reports due to his work he has continued pain He denies new injury/trauma to right shoulder    Past Medical History  Diagnosis Date  . Hyperlipemia    Past Surgical History  Procedure Laterality Date  . Shoulder surgery Right    No family history on file. History  Substance Use Topics  . Smoking status: Current Every Day Smoker -- 0.75 packs/day    Types: Cigarettes  . Smokeless tobacco: Not on file  . Alcohol Use: No    Review of Systems  Respiratory: Negative for shortness of breath.   Cardiovascular: Negative for chest pain.  Musculoskeletal: Positive for arthralgias.  Neurological: Negative for weakness.      Allergies  Review of patient's allergies indicates no known allergies.  Home Medications   Prior to Admission medications   Not on File   BP 117/83  Pulse 71  Temp(Src) 97.7 F (36.5 C) (Oral)  Resp 20  Ht  (1.676 m)  Wt 160 lb (72.576 kg)  BMI 25.84 kg/m2  SpO2 96% Physical Exam CONSTITUTIONAL: Well developed/well nourished HEAD: Normocephalic/atraumatic ENMT: Mucous membranes moist NECK: supple no meningeal signs CV: S1/S2 noted, no  murmurs/rubs/gallops noted LUNGS: Lungs are clear to auscultation bilaterally, no apparent distress ABDOMEN: soft, nontender, no rebound or guarding NEURO: Pt is awake/alert, moves all extremitiesx4 EXTREMITIES: pulses normal, full ROM.  Tenderness to right clavicle.  No crepitus.  He can range right shoulder with difficulty SKIN: warm, color normal. Well healed scar to right clavicle.  No bruising/eryhema/edema noted PSYCH: no abnormalities of mood noted  ED Course  Procedures Pt reports daily pain in right clavicle/shoulder since surgery (pt had ORIF of right clavicle at [facility] July 2015) He has been seen multiple times in the ED for ongoing pain control This appears to be a chronic process There is no evidence of acute injury Offered ibuprofen and advised need for outpatient management.    MDM   Final diagnoses:  Clavicle pain, right    Nursing notes including past medical history and social history reviewed and considered in documentation Previous records reviewed and considered Narcotic database reviewed and considered in decision making    Joya Gaskins, MD 04/01/14 [phone]

## 2014-07-14 ENCOUNTER — Encounter (HOSPITAL_COMMUNITY): Payer: Self-pay

## 2014-07-14 ENCOUNTER — Emergency Department (HOSPITAL_COMMUNITY): Payer: Self-pay

## 2014-07-14 ENCOUNTER — Emergency Department (HOSPITAL_COMMUNITY)
Admission: EM | Admit: 2014-07-14 | Discharge: 2014-07-14 | Disposition: A | Discharge status: Home / Self Care | Payer: Self-pay | Attending: Emergency Medicine | Admitting: Emergency Medicine

## 2014-07-14 DIAGNOSIS — Z23 Encounter for immunization: Secondary | ICD-10-CM | POA: Insufficient documentation

## 2014-07-14 DIAGNOSIS — T1490XA Injury, unspecified, initial encounter: Secondary | ICD-10-CM

## 2014-07-14 DIAGNOSIS — Y9289 Other specified places as the place of occurrence of the external cause: Secondary | ICD-10-CM | POA: Insufficient documentation

## 2014-07-14 DIAGNOSIS — W294XXA Contact with nail gun, initial encounter: Secondary | ICD-10-CM

## 2014-07-14 DIAGNOSIS — S6991XA Unspecified injury of right wrist, hand and finger(s), initial encounter: Secondary | ICD-10-CM | POA: Insufficient documentation

## 2014-07-14 DIAGNOSIS — W458XXA Other foreign body or object entering through skin, initial encounter: Secondary | ICD-10-CM | POA: Insufficient documentation

## 2014-07-14 DIAGNOSIS — Y9389 Activity, other specified: Secondary | ICD-10-CM | POA: Insufficient documentation

## 2014-07-14 DIAGNOSIS — E785 Hyperlipidemia, unspecified: Secondary | ICD-10-CM | POA: Insufficient documentation

## 2014-07-14 DIAGNOSIS — Z720 Tobacco use: Secondary | ICD-10-CM | POA: Insufficient documentation

## 2014-07-14 DIAGNOSIS — Y990 Civilian activity done for income or pay: Secondary | ICD-10-CM | POA: Insufficient documentation

## 2014-07-14 MED ORDER — OXYCODONE-ACETAMINOPHEN 5-325 MG PO TABS
1.0000 | ORAL_TABLET | Freq: Once | ORAL | Status: AC
Start: 2014-07-14 — End: 2014-07-14
  Administered 2014-07-14: 1 via ORAL
  Filled 2014-07-14: qty 1

## 2014-07-14 MED ORDER — TETANUS-DIPHTH-ACELL PERTUSSIS 5-2.5-18.5 LF-MCG/0.5 IM SUSP
0.5000 mL | Freq: Once | INTRAMUSCULAR | Status: AC
  Administered 2014-07-14: 0.5 mL via INTRAMUSCULAR
  Filled 2014-07-14: qty 0.5

## 2014-07-14 MED ORDER — POVIDONE-IODINE 10 % EX SOLN
CUTANEOUS | Status: DC | PRN
  Filled 2014-07-14 (×2): qty 118

## 2014-07-14 MED ORDER — LIDOCAINE HCL (PF) 1 % IJ SOLN
5.0000 mL | Freq: Once | INTRAMUSCULAR | Status: DC
  Filled 2014-07-14: qty 5

## 2014-07-14 MED ORDER — CEPHALEXIN 500 MG PO CAPS: 500.0000 mg | ORAL_CAPSULE | Freq: Four times a day (QID) | ORAL | Status: DC

## 2014-07-14 MED ORDER — HYDROCODONE-ACETAMINOPHEN 5-325 MG PO TABS: 1.0000 | ORAL_TABLET | ORAL | Status: DC | PRN

## 2014-07-14 NOTE — ED Provider Notes (Signed)
This chart was scribed for [name], DO by Ronney Lion, ED Scribe. This patient was seen in room APA18/APA18.   TIME SEEN: 2:20 PM  CHIEF COMPLAINT: Extremity pain  HPI: Lucas [name] is a 44 y.o. male with history of hyperlipidemia who presents to the Emergency Department complaining of a nail gun wound to his right thumb that occurred while he was working prior to arrival. He denies any other injury. He is left-handed dominant. Denies numbness, tingling or focal weakness. Unclear when his tetanus shot was.  ROS: See HPI Constitutional: no fever  Eyes: no drainage  ENT: no runny nose   Cardiovascular:  no chest pain  Resp: no SOB  GI: no vomiting GU: no dysuria Integumentary: no rash  Allergy: no hives  Musculoskeletal: no leg swelling  Neurological: no slurred speech ROS otherwise negative  PAST MEDICAL HISTORY/PAST SURGICAL HISTORY:  Past Medical History  Diagnosis Date  . Hyperlipemia     MEDICATIONS:  Prior to Admission medications   Not on File    ALLERGIES:  No Known Allergies  SOCIAL HISTORY:  History  Substance Use Topics  . Smoking status: Current Every Day Smoker -- 0.75 packs/day    Types: Cigarettes  . Smokeless tobacco: Not on file  . Alcohol Use: No    FAMILY HISTORY: No family history on file.  EXAM: BP 130/107 mmHg  Pulse 105  Temp(Src) 98.1 F (36.7 C) (Oral)  Resp 20  Ht  (1.676 m)  Wt 160 lb (72.576 kg)  BMI 25.84 kg/m2  SpO2 97% CONSTITUTIONAL: Alert and oriented and responds appropriately to questions. Well-appearing; well-nourished HEAD: Normocephalic EYES: Conjunctivae clear, PERRL ENT: normal nose; no rhinorrhea; moist mucous membranes; pharynx without lesions noted NECK: Supple, no meningismus, no LAD  CARD: RRR; S1 and S2 appreciated; no murmurs, no clicks, no rubs, no gallops; 2+ radial pulse bilaterally RESP: Normal chest excursion without splinting or tachypnea; breath sounds clear and equal bilaterally; no  wheezes, no rhonchi, no rales,  ABD/GI: Normal bowel sounds; non-distended; soft, non-tender, no rebound, no guarding BACK:  The back appears normal and is non-tender to palpation, there is no CVA tenderness EXT: Nail to the tip of right hand; normal capillary refill; normal sensation; normal ROM in all fingers; otherwise no injury to his extremities, normal range of motion in all joints, no joint effusion, no erythema or warmth, no other bony injury SKIN: Normal color for age and race; warm NEURO: Moves all extremities equally PSYCH: The patient's mood and manner are appropriate. Grooming and personal hygiene are appropriate.      MEDICAL DECISION MAKING: Patient here after he has a nail for the tip of his right thumb. X-ray shows no bony involvement. We will perform digital block and remove nail. We'll discharge home on antibiotics. We'll update tetanus. Has received Percocet in the ED waiting room.   ED PROGRESS:    2:53 PM -  digital block performedd I removed nail from his right thumb. He states that it appears everything has been removed from his thumb, and denies XR to check for any remaining foreign body.  Nail appears intact.  He is being sent home with antibiotics and pain medication. He is warned about working under the influence of pain medication.  discussed return precautions and supportive care instructions. Area has been irrigated copiously, bacitracin applied and sterile dressing applied.    FOREIGN BODY REMOVAL Date/Time: 07/14/2014 2:50 PM Performed by: Raelyn Number Authorized by: Raelyn Number Consent:  Verbal consent obtained from patient Body area: tip right thumb Anesthesia: local infiltration Local anesthetic: lidocaine 1% without epinephrine Anesthetic total: 3 ml Patient sedated: no Patient restrained: no Patient cooperative: yes Dressing: antibiotic ointment and dressing applied Tendon involvement: none Complexity: simple; completely through the tip of  the right thumb with no nail bed injury and no bony involvement  1 objects recovered. Objects recovered: nail   NERVE BLOCK Date/Time: 07/14/2014 2:50 PM Performed by: Raelyn Number Authorized by: Raelyn Number Consent: Verbal consent obtained. Consent given by: patient Indications: pain relief and debridement Body area: upper extremity Nerve: digital Laterality: right Patient sedated: no Preparation: Patient was prepped and draped in the usual sterile fashion. Needle gauge: 25 G Location technique: anatomical landmarks Local anesthetic: lidocaine 1% without epinephrine Anesthetic total: 3 ml Outcome: pain improved Patient tolerance: Patient tolerated the procedure well with no immediate complications    I personally performed the services described in this documentation, which was scribed in my presence. The recorded information has been reviewed and is accurate.      [name], DO 07/14/14 2154

## 2014-07-14 NOTE — ED Notes (Signed)
Pt states he was using a nail gun and a nail went through his right thumb

## 2014-07-14 NOTE — Discharge Instructions (Signed)
Puncture Wound °A puncture wound is an injury that extends through all layers of the skin and into the tissue beneath the skin (subcutaneous tissue). Puncture wounds become infected easily because germs often enter the body and go beneath the skin during the injury. Having a deep wound with a small entrance point makes it difficult for your caregiver to adequately clean the wound. This is especially true if you have stepped on a nail and it has passed through a dirty shoe or other situations where the wound is obviously contaminated. °CAUSES  °Many puncture wounds involve glass, nails, splinters, fish hooks, or other objects that enter the skin (foreign bodies). A puncture wound may also be caused by a human bite or animal bite. °DIAGNOSIS  °A puncture wound is usually diagnosed by your history and a physical exam. You may need to have an X-ray or an ultrasound to check for any foreign bodies still in the wound. °TREATMENT  °· Your caregiver will clean the wound as thoroughly as possible. Depending on the location of the wound, a bandage (dressing) may be applied. °· Your caregiver might prescribe antibiotic medicines. °· You may need a follow-up visit to check on your wound. Follow all instructions as directed by your caregiver. °HOME CARE INSTRUCTIONS  °· Change your dressing once per day, or as directed by your caregiver. If the dressing sticks, it may be removed by soaking the area in water. °· If your caregiver has given you follow-up instructions, it is very important that you return for a follow-up appointment. Not following up as directed could result in a chronic or permanent injury, pain, and disability. °· Only take over-the-counter or prescription medicines for pain, discomfort, or fever as directed by your caregiver. °· If you are given antibiotics, take them as directed. Finish them even if you start to feel better. °You may need a tetanus shot if: °· You cannot remember when you had your last tetanus  shot. °· You have never had a tetanus shot. °If you got a tetanus shot, your arm may swell, get red, and feel warm to the touch. This is common and not a problem. If you need a tetanus shot and you choose not to have one, there is a rare chance of getting tetanus. Sickness from tetanus can be serious. °You may need a rabies shot if an animal bite caused your puncture wound. °SEEK MEDICAL CARE IF:  °· You have redness, swelling, or increasing pain in the wound. °· You have red streaks going away from the wound. °· You notice a bad smell coming from the wound or dressing. °· You have yellowish-white fluid (pus) coming from the wound. °· You are treated with an antibiotic for infection, but the infection is not getting better. °· You notice something in the wound, such as rubber from your shoe, cloth, or another object. °· You have a fever. °· You have severe pain. °· You have difficulty breathing. °· You feel dizzy or faint. °· You cannot stop vomiting. °· You lose feeling, develop numbness, or cannot move a limb below the wound. °· Your symptoms worsen. °MAKE SURE YOU: °· Understand these instructions. °· Will watch your condition. °· Will get help right away if you are not doing well or get worse. °Document Released: 04/08/2005 Document Revised: 09/21/2011 Document Reviewed: 12/16/2010 °[employer]® Patient Information ©2015 [employer]. This information is not intended to replace advice given to you by your health care provider. Make sure you discuss any questions you   have with your health care provider. ° °

## 2014-11-03 NOTE — Consult Note (Signed)
PATIENT NAME:  Lucas [name], Lathaniel MR#:  [id number] DATE OF BIRTH:  August 03, 1970  DATE OF CONSULTATION:  02/03/2014  REFERRING PHYSICIAN:   CONSULTING PHYSICIAN:  Ruthe MannanPeter Yoni Lobos, MD  CHIEF COMPLAINT: Right shoulder pain.  HISTORY: The patient is a 44 year old man who was on a roof helping a friend when he fell off the roof. He suffered an injury to his right shoulder. He states that he was not knocked unconscious. He denies any injury elsewhere. He endorses pain only over the right shoulder.   Of note, this was not a workman's comp-type injury; this is performed at home.   PAST MEDICAL HISTORY: None.   SOCIAL HISTORY: The patient smokes approximately 3/4 pack per day. He normally works as a Designer, fashion/clothingroofer, but as stated before, this is not a workman's comp-related injury. The patient does not have private health insurance and is not on Medicaid.   FAMILY HISTORY: Noncontributory.   MEDICATIONS: None.   ALLERGIES: None.   REVIEW OF SYSTEMS: The patient denies any injury elsewhere. He denies any fever, chills, nausea, vomiting, chest pain, shortness of breath, difficulty breathing, difficulty urinating, changes in his mood or affect, or any other symptoms. The review of systems was negative.   PHYSICAL EXAMINATION: GENERAL: Vitals are stable. The patient is awake and alert. He endorses right shoulder pain, but no pain elsewhere.  HEAD: Atraumatic.  CHEST: Equal and symmetric. No increased work of breathing. The patient has a deformity over the right shoulder.  VASCULAR: Extremities are warm and well-perfused.  NEUROLOGIC: He has intact nerve functions about the right upper extremity. Examination of the shoulder girdle is limited secondary to pain.  MUSCULOSKELETAL: The patient has deformity over the right shoulder. There is no tenting of the skin. There are no open wounds.   X-RAYS: X-rays demonstrate a right clavicle fracture with a butterfly fragment and shortening of approximately 2 cm.   ASSESSMENT:  A 44 year old man with a right comminuted and shortened clavicle fracture.   DISCUSSION: I had a long discussion with the patient. I explained that he does meet the operative indications for having his clavicle fracture treated operatively. However, given the patient's social circumstances, he would like to avoid surgery. I think that this is reasonable. He was placed in a simple sling and given instructions to follow up in 2 weeks.   ____________________________ Ruthe MannanPeter Ola Fawver, MD pa:sk D: 02/04/2014 18:28:00 ET T: 02/04/2014 22:41:37 ET JOB#: [id number]  cc: Ruthe MannanPeter Damauri Minion, MD, <Dictator> Althea GrimmerPeter J. Mickle PlumbApel, MD PhD Orthopaedic Surgery ELECTRONICALLY SIGNED 02/05/2014 21:38

## 2015-04-21 ENCOUNTER — Emergency Department (HOSPITAL_COMMUNITY): Payer: Self-pay

## 2015-04-21 ENCOUNTER — Encounter (HOSPITAL_COMMUNITY): Payer: Self-pay | Admitting: Emergency Medicine

## 2015-04-21 ENCOUNTER — Emergency Department (HOSPITAL_COMMUNITY)
Admission: EM | Admit: 2015-04-21 | Discharge: 2015-04-21 | Disposition: A | Discharge status: Home / Self Care | Payer: Self-pay | Attending: Emergency Medicine | Admitting: Emergency Medicine

## 2015-04-21 DIAGNOSIS — Z792 Long term (current) use of antibiotics: Secondary | ICD-10-CM | POA: Insufficient documentation

## 2015-04-21 DIAGNOSIS — Z720 Tobacco use: Secondary | ICD-10-CM | POA: Insufficient documentation

## 2015-04-21 DIAGNOSIS — Y998 Other external cause status: Secondary | ICD-10-CM | POA: Insufficient documentation

## 2015-04-21 DIAGNOSIS — W132XXA Fall from, out of or through roof, initial encounter: Secondary | ICD-10-CM | POA: Insufficient documentation

## 2015-04-21 DIAGNOSIS — Y9389 Activity, other specified: Secondary | ICD-10-CM | POA: Insufficient documentation

## 2015-04-21 DIAGNOSIS — Y9289 Other specified places as the place of occurrence of the external cause: Secondary | ICD-10-CM | POA: Insufficient documentation

## 2015-04-21 DIAGNOSIS — S93409A Sprain of unspecified ligament of unspecified ankle, initial encounter: Secondary | ICD-10-CM | POA: Insufficient documentation

## 2015-04-21 DIAGNOSIS — Z8639 Personal history of other endocrine, nutritional and metabolic disease: Secondary | ICD-10-CM | POA: Insufficient documentation

## 2015-04-21 DIAGNOSIS — S93422A Sprain of deltoid ligament of left ankle, initial encounter: Secondary | ICD-10-CM

## 2015-04-21 MED ORDER — IBUPROFEN 800 MG PO TABS
800.0000 mg | ORAL_TABLET | Freq: Once | ORAL | Status: AC
  Administered 2015-04-21: 800 mg via ORAL
  Filled 2015-04-21: qty 1

## 2015-04-21 MED ORDER — NAPROXEN 500 MG PO TABS: 500.0000 mg | ORAL_TABLET | Freq: Two times a day (BID) | ORAL | Status: DC

## 2015-04-21 NOTE — ED Provider Notes (Signed)
CSN: [id number]     Arrival date & time 04/21/15  1433 History  By signing my name below, I, Murriel Hopper, attest that this documentation has been prepared under the direction and in the presence of Vaunda Gutterman, PA-C.  Electronically Signed: Murriel Hopper, ED Scribe. 04/21/2015. 3:01 PM.    Chief Complaint  Patient presents with  . Ankle Pain      Patient is a 44 y.o. male presenting with ankle pain. The history is provided by the patient. No language interpreter was used.  Ankle Pain Location:  Ankle Time since incident:  2 days Ankle location:  L ankle Pain details:    Radiates to:  Does not radiate   Severity:  Moderate   Onset quality:  Sudden   Duration:  2 days   Timing:  Constant Chronicity:  New Dislocation: no   Associated symptoms: no back pain and no fever    HPI Comments: Lucas [name] is a 44 y.o. male who presents to the Emergency Department complaining of constant left foot and ankle pain that has been present for two days after he fell off of a roof and landed on his feet. 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 Pt states that he is able to ambulate but is having trouble doing so normally because of his pain. Pt reports when he moves his ankle to the left and right that he hears popping and cracking noises. Pt denies hitting his head, loss of consciousness neck or back pain, swelling or open wounds.  Pt states he has not taken any medication or done anything to treat his symptoms.    Past Medical History  Diagnosis Date  . Hyperlipemia    Past Surgical History  Procedure Laterality Date  . Shoulder surgery Right    History reviewed. No pertinent family history. Social History  Substance Use Topics  . Smoking status: Current Every Day Smoker -- 0.75 packs/day    Types: Cigarettes  . Smokeless tobacco: None  . Alcohol Use: No    Review of Systems   Constitutional: Negative for fever and chills.  Gastrointestinal: Negative for nausea and vomiting.  Genitourinary: Negative for dysuria and difficulty urinating.  Musculoskeletal: Positive for arthralgias (left ankle pain). Negative for myalgias, back pain and joint swelling.  Skin: Negative for color change and wound.  Neurological: Negative for weakness and numbness.  All other systems reviewed and are negative.     Allergies  Review of patient's allergies indicates no known allergies.  Home Medications   Prior to Admission medications   Medication Sig Start Date End Date Taking? Authorizing Provider  cephALEXin (KEFLEX) 500 MG capsule Take 1 capsule (500 mg total) by mouth 4 (four) times daily. 07/14/14   Kristen N Ward, DO  HYDROcodone-acetaminophen (NORCO/VICODIN) 5-325 MG per tablet Take 1 tablet by mouth every 4 (four) hours as needed. 07/14/14   Kristen N Ward, DO   BP 155/97 mmHg  Pulse 78  Temp(Src) 98.2 F (36.8 C) (Oral)  Resp 15  Ht  (1.676 m)  Wt 151 lb (68.493 kg)  BMI 24.38 kg/m2  SpO2 100% Physical Exam  Constitutional: He is oriented to person, place, and time. He appears well-developed and well-nourished.  HENT:  Head: Normocephalic and atraumatic.  Neck: Normal range of motion.  Cardiovascular: Normal rate.  Exam reveals no gallop and no friction rub.   No  murmur heard. Pulmonary/Chest: Effort normal. No respiratory distress. He has no wheezes. He has no rales. He exhibits no tenderness.  Abdominal: He exhibits no distension.  Musculoskeletal:  Mild tenderness to palpation of the lateral left ankle  No ecchymosis, bony deformity, or edema No spinal tenderness  Neurological: He is alert and oriented to person, place, and time.  Skin: Skin is warm and dry.  Psychiatric: He has a normal mood and affect.  Nursing note and vitals reviewed.   ED Course  Procedures (including critical care time)  DIAGNOSTIC STUDIES: Oxygen Saturation is 100% on  room air, normal by my interpretation.    COORDINATION OF CARE: 2:55 PM Discussed treatment plan with pt at bedside and pt agreed to plan.   Labs Review Labs Reviewed - No data to display  Imaging Review Dg Ankle Complete Left  04/21/2015   CLINICAL DATA:  Fall, left ankle pain.  EXAM: LEFT ANKLE COMPLETE - 3+ VIEW  COMPARISON:  None.  FINDINGS: No evidence of acute fracture or subluxation. No focal bone lesion or bone destruction. Bone cortex and trabecular architecture appear intact. No radiopaque soft tissue foreign bodies.  IMPRESSION: No acute fracture or dislocation identified about the left ankle.   Electronically Signed   By: Ted Mcalpine M.D.   On: 04/21/2015 15:05   I have personally reviewed and evaluated these images and lab results as part of my medical decision-making.   EKG Interpretation None      MDM   Final diagnoses:  Sprain, ankle joint, medial, left, initial encounter    Patient is well-appearing. Ambulates with a steady gait. X-rays negative for fracture, likely sprain. No spinal tenderness on exam. Patient agrees to symptom at a treatment and close orthopedic follow-up if needed.  ASO applied, pain improved, remains NV intact.  I personally performed the services described in this documentation, which was scribed in my presence. The recorded information has been reviewed and is accurate.    Pauline Aus, PA-C 04/21/15 1539  Bethann Berkshire, MD 04/24/15 [phone]

## 2015-04-21 NOTE — Discharge Instructions (Signed)
Ankle Sprain °An ankle sprain is an injury to the strong, fibrous tissues (ligaments) that hold your ankle bones together.  °HOME CARE  °· Put ice on your ankle for 1-2 days or as told by your doctor. °¨ Put ice in a plastic bag. °¨ Place a towel between your skin and the bag. °¨ Leave the ice on for 15-20 minutes at a time, every 2 hours while you are awake. °· Only take medicine as told by your doctor. °· Raise (elevate) your injured ankle above the level of your heart as much as possible for 2-3 days. °· Use crutches if your doctor tells you to. Slowly put your own weight on the affected ankle. Use the crutches until you can walk without pain. °· If you have a plaster splint: °¨ Do not rest it on anything harder than a pillow for 24 hours. °¨ Do not put weight on it. °¨ Do not get it wet. °¨ Take it off to shower or bathe. °· If given, use an elastic wrap or support stocking for support. Take the wrap off if your toes lose feeling (numb), tingle, or turn cold or blue. °· If you have an air splint: °¨ Add or let out air to make it comfortable. °¨ Take it off at night and to shower and bathe. °¨ Wiggle your toes and move your ankle up and down often while you are wearing it. °GET HELP IF: °· You have rapidly increasing bruising or puffiness (swelling). °· Your toes feel very cold. °· You lose feeling in your foot. °· Your medicine does not help your pain. °GET HELP RIGHT AWAY IF:  °· Your toes lose feeling (numb) or turn blue. °· You have severe pain that is increasing. °MAKE SURE YOU:  °· Understand these instructions. °· Will watch your condition. °· Will get help right away if you are not doing well or get worse. °  °This information is not intended to replace advice given to you by your health care provider. Make sure you discuss any questions you have with your health care provider. °  °Document Released: 12/16/2007 Document Revised: 07/20/2014 Document Reviewed: 01/11/2012 °Elsevier Interactive Patient  Education ©2016 Elsevier Inc. ° °

## 2015-04-21 NOTE — ED Notes (Signed)
Pt states that he fell off of a roof and landed on his feet and injured his left ankle.  Denies other pain at triage.

## 2015-07-30 ENCOUNTER — Encounter (HOSPITAL_COMMUNITY): Payer: Self-pay | Admitting: Emergency Medicine

## 2015-07-30 ENCOUNTER — Emergency Department (HOSPITAL_COMMUNITY): Payer: Self-pay

## 2015-07-30 ENCOUNTER — Emergency Department (HOSPITAL_COMMUNITY)
Admission: EM | Admit: 2015-07-30 | Discharge: 2015-07-30 | Disposition: A | Discharge status: Home / Self Care | Payer: Self-pay | Attending: Emergency Medicine | Admitting: Emergency Medicine

## 2015-07-30 DIAGNOSIS — Y9289 Other specified places as the place of occurrence of the external cause: Secondary | ICD-10-CM | POA: Insufficient documentation

## 2015-07-30 DIAGNOSIS — F1410 Cocaine abuse, uncomplicated: Secondary | ICD-10-CM | POA: Insufficient documentation

## 2015-07-30 DIAGNOSIS — F17210 Nicotine dependence, cigarettes, uncomplicated: Secondary | ICD-10-CM | POA: Insufficient documentation

## 2015-07-30 DIAGNOSIS — Y9389 Activity, other specified: Secondary | ICD-10-CM | POA: Insufficient documentation

## 2015-07-30 DIAGNOSIS — Z792 Long term (current) use of antibiotics: Secondary | ICD-10-CM | POA: Insufficient documentation

## 2015-07-30 DIAGNOSIS — W1839XA Other fall on same level, initial encounter: Secondary | ICD-10-CM | POA: Insufficient documentation

## 2015-07-30 DIAGNOSIS — Y998 Other external cause status: Secondary | ICD-10-CM | POA: Insufficient documentation

## 2015-07-30 DIAGNOSIS — Z791 Long term (current) use of non-steroidal anti-inflammatories (NSAID): Secondary | ICD-10-CM | POA: Insufficient documentation

## 2015-07-30 DIAGNOSIS — Z8639 Personal history of other endocrine, nutritional and metabolic disease: Secondary | ICD-10-CM | POA: Insufficient documentation

## 2015-07-30 DIAGNOSIS — F1010 Alcohol abuse, uncomplicated: Secondary | ICD-10-CM | POA: Insufficient documentation

## 2015-07-30 DIAGNOSIS — S6992XA Unspecified injury of left wrist, hand and finger(s), initial encounter: Secondary | ICD-10-CM | POA: Insufficient documentation

## 2015-07-30 MED ORDER — CARBAMAZEPINE 200 MG PO TABS: ORAL_TABLET | ORAL | Status: DC

## 2015-07-30 MED ORDER — PROMETHAZINE HCL 25 MG PO TABS: 25.0000 mg | ORAL_TABLET | Freq: Four times a day (QID) | ORAL | Status: DC | PRN

## 2015-07-30 MED ORDER — ACETAMINOPHEN 500 MG PO TABS
1000.0000 mg | ORAL_TABLET | Freq: Once | ORAL | Status: AC
  Administered 2015-07-30: 1000 mg via ORAL
  Filled 2015-07-30: qty 2

## 2015-07-30 NOTE — ED Notes (Signed)
Pt wants help with ETOH and Drug Abuse. States he drinks "anything he can get his hands on" daily. States he uses cocaine daily. States last use of both was at 8pm yesterday. States he has been abusing both for 2 1/2 years.

## 2015-07-30 NOTE — ED Notes (Signed)
Discharge instructions reviewed with patient and outpt resources given for pt to utilized upon discharge. RX reviewed. Pt eating breakfast just before discharge and knows he is free to leave when done. Alert/oriented. VSS.

## 2015-07-30 NOTE — Discharge Instructions (Signed)
Alcohol Use Disorder °Alcohol use disorder is a mental disorder. It is not a one-time incident of heavy drinking. Alcohol use disorder is the excessive and uncontrollable use of alcohol over time that leads to problems with functioning in one or more areas of daily living. People with this disorder risk harming themselves and others when they drink to excess. Alcohol use disorder also can cause other mental disorders, such as mood and anxiety disorders, and serious physical problems. People with alcohol use disorder often misuse other drugs.  °Alcohol use disorder is common and widespread. Some people with this disorder drink alcohol to cope with or escape from negative life events. Others drink to relieve chronic pain or symptoms of mental illness. People with a family history of alcohol use disorder are at higher risk of losing control and using alcohol to excess.  °Drinking too much alcohol can cause injury, accidents, and health problems. One drink can be too much when you are: °· Working. °· Pregnant or breastfeeding. °· Taking medicines. Ask your doctor. °· Driving or planning to drive. °SYMPTOMS  °Signs and symptoms of alcohol use disorder may include the following:  °· Consumption of alcohol in larger amounts or over a longer period of time than intended. °· Multiple unsuccessful attempts to cut down or control alcohol use.   °· A great deal of time spent obtaining alcohol, using alcohol, or recovering from the effects of alcohol (hangover). °· A strong desire or urge to use alcohol (cravings).   °· Continued use of alcohol despite problems at work, school, or home because of alcohol use.   °· Continued use of alcohol despite problems in relationships because of alcohol use. °· Continued use of alcohol in situations when it is physically hazardous, such as driving a car. °· Continued use of alcohol despite awareness of a physical or psychological problem that is likely related to alcohol use. Physical  problems related to alcohol use can involve the brain, heart, liver, stomach, and intestines. Psychological problems related to alcohol use include intoxication, depression, anxiety, psychosis, delirium, and dementia.   °· The need for increased amounts of alcohol to achieve the same desired effect, or a decreased effect from the consumption of the same amount of alcohol (tolerance). °· Withdrawal symptoms upon reducing or stopping alcohol use, or alcohol use to reduce or avoid withdrawal symptoms. Withdrawal symptoms include: °¨ Racing heart. °¨ Hand tremor. °¨ Difficulty sleeping. °¨ Nausea. °¨ Vomiting. °¨ Hallucinations. °¨ Restlessness. °¨ Seizures. °DIAGNOSIS °Alcohol use disorder is diagnosed through an assessment by your health care provider. Your health care provider may start by asking three or four questions to screen for excessive or problematic alcohol use. To confirm a diagnosis of alcohol use disorder, at least two symptoms must be present within a 12-month period. The severity of alcohol use disorder depends on the number of symptoms: °· Mild--two or three. °· Moderate--four or five. °· Severe--six or more. °Your health care provider may perform a physical exam or use results from lab tests to see if you have physical problems resulting from alcohol use. Your health care provider may refer you to a mental health professional for evaluation. °TREATMENT  °Some people with alcohol use disorder are able to reduce their alcohol use to low-risk levels. Some people with alcohol use disorder need to quit drinking alcohol. When necessary, mental health professionals with specialized training in substance use treatment can help. Your health care provider can help you decide how severe your alcohol use disorder is and what type of treatment you need.   The following forms of treatment are available:   Detoxification. Detoxification involves the use of prescription medicines to prevent alcohol withdrawal  symptoms in the first week after quitting. This is important for people with a history of symptoms of withdrawal and for heavy drinkers who are likely to have withdrawal symptoms. Alcohol withdrawal can be dangerous and, in severe cases, cause death. Detoxification is usually provided in a hospital or in-patient substance use treatment facility.  Counseling or talk therapy. Talk therapy is provided by substance use treatment counselors. It addresses the reasons people use alcohol and ways to keep them from drinking again. The goals of talk therapy are to help people with alcohol use disorder find healthy activities and ways to cope with life stress, to identify and avoid triggers for alcohol use, and to handle cravings, which can cause relapse.  Medicines.Different medicines can help treat alcohol use disorder through the following actions:  Decrease alcohol cravings.  Decrease the positive reward response felt from alcohol use.  Produce an uncomfortable physical reaction when alcohol is used (aversion therapy).  Support groups. Support groups are run by people who have quit drinking. They provide emotional support, advice, and guidance. These forms of treatment are often combined. Some people with alcohol use disorder benefit from intensive combination treatment provided by specialized substance use treatment centers. Both inpatient and outpatient treatment programs are available.   This information is not intended to replace advice given to you by your health care provider. Make sure you discuss any questions you have with your health care provider.   Document Released: 08/06/2004 Document Revised: 07/20/2014 Document Reviewed: 10/06/2012 Elsevier Interactive Patient Education 2016 [employer]  Polysubstance Abuse When people abuse more than one drug or type of drug it is called polysubstance or polydrug abuse. For example, many smokers also drink alcohol. This is one form of polydrug  abuse. Polydrug abuse also refers to the use of a drug to counteract an unpleasant effect produced by another drug. It may also be used to help with withdrawal from another drug. People who take stimulants may become agitated. Sometimes this agitation is countered with a tranquilizer. This helps protect against the unpleasant side effects. Polydrug abuse also refers to the use of different drugs at the same time.  Anytime drug use is interfering with normal living activities, it has become abuse. This includes problems with family and friends. Psychological dependence has developed when your mind tells you that the drug is needed. This is usually followed by physical dependence which has developed when continuing increases of drug are required to get the same feeling or "high". This is known as addiction or chemical dependency. A person's risk is much higher if there is a history of chemical dependency in the family. SIGNS OF CHEMICAL DEPENDENCY  You have been told by friends or family that drugs have become a problem.  You fight when using drugs.  You are having blackouts (not remembering what you do while using).  You feel sick from using drugs but continue using.  You lie about use or amounts of drugs (chemicals) used.  You need chemicals to get you going.  You are suffering in work performance or in school because of drug use.  You get sick from use of drugs but continue to use anyway.  You need drugs to relate to people or feel comfortable in social situations.  You use drugs to forget problems. "Yes" answered to any of the above signs of chemical dependency indicates there  are problems. The longer the use of drugs continues, the greater the problems will become. If there is a family history of drug or alcohol use, it is best not to experiment with these drugs. Continual use leads to tolerance. After tolerance develops more of the drug is needed to get the same feeling. This is followed  by addiction. With addiction, drugs become the most important part of life. It becomes more important to take drugs than participate in the other usual activities of life. This includes relating to friends and family. Addiction is followed by dependency. Dependency is a condition where drugs are now needed not just to get high, but to feel normal. Addiction cannot be cured but it can be stopped. This often requires outside help and the care of professionals. Treatment centers are listed in the yellow pages under: Cocaine, Narcotics, and Alcoholics Anonymous. Most hospitals and clinics can refer you to a specialized care center. Talk to your caregiver if you need help.   This information is not intended to replace advice given to you by your health care provider. Make sure you discuss any questions you have with your health care provider.   Document Released: 02/18/2005 Document Revised: 09/21/2011 Document Reviewed: 07/04/2014 Elsevier Interactive Patient Education 2016 [employer]. Substance Abuse Treatment Programs  Intensive Outpatient Programs [facility]     [address]      [place], [place]                   [phone]       [facility] [address] [employer] [address] [place], [place] [phone]  [facility]     (Inpatient and outpatient)     [address]           [phone]    [facility] [phone] (Suboxone and Methadone)  [address]      [place], [place] [address]      [phone]       [address] [place], [place] [phone]  [facility] (Outpatient/Inpatient, Chemical)    (insurance only) [phone]             [facility] (Groups & Residential) [place], [place] [phone]     [facility]     [address]     [place], [place]      [phone]       [facility] (for caregivers and family) [phone] [address] Laurell Josephs.  [address] [place], [place] [phone]      Residential Treatment Programs [facility]      [address], [place], [place] [address]  [phone]       [facility] [address], [place], [place] [address] [phone]  [facility] [place]        [phone]       [facility] [phone]  [facility] ([facility]             [address]                                         [place], [place]  [phone] or [phone]                               [facility] [address] [place], [address] [phone]  [facility]    [address]      [place], [place]     [phone]       [facility] [address] [place], [place] [phone]  [facility]   [address] [place], [place] [address]     [phone]      Admissions: 8am-3pm M-F  [facility] ([facility]) [address] [place], [place] [phone]  [facility]: Residential Program [phone] Days)   [place], [place]      [phone] or [phone]     [facility]: [facility] [place], [place] (Walk in Hours over the weekend or by referral)  [facility] [address] [place], [place], [place] [address] [phone]  Crisis Mobile: [facility]:  [phone] (for crisis response 24 hours a day) [facility] Hotline:      3368866354 Outpatient Psychiatry and Counseling  [facility]: Mobile Crisis Management 24 hours:  [phone]  [facility] [employer] sliding scale fee and walk in schedule: M-F 8am-12pm/1pm-3pm [address]  [place], [place] [address] [phone]  [facility] [address] [place], [place] [address] [phone]  [facility] ([facility] [employer] new patient walk-in appointments available Monday - Friday 8am  -3pm.          [address] [place], [place] [address] [phone] or crisis line- [phone]  [facility]/ Intensive Outpatient Therapy Program [address] [place], [place] [address] [phone]  [facility]                  Crisis Services      [phone] [address]     [place], [place] [address]                 [facility]   [facility] [phone]. [address] [place], [place] [address]   [facility] of Care          [address] [place]  [place], [place] [address]       [phone]  [facility] [address] [place], [place] [address] [phone]  [facility]    [address]    [place], [place] [address]     [phone]       Andee Poles, MD     [address]     [place] [address]     [phone]       [facility] [address] [place] [address]  [facility]     [address] [place], [place]      [phone]       [facility] Eulogio Ditch, MD [address] [place], [place] [address] [phone]  [facility]     [address]     [place], [place] [address]     [phone]       [facility] [address] [place], [place] [address] [phone] Resources for Temporary  Residential Assistance/Crisis Theme park manager [facility]) M-F 8am-3pm   [address] [place], [place] [address]   [phone] Services include: laundry, barbering, support groups, case management, phone  & computer access, showers, AA/NA mtgs, mental health/substance abuse nurse, job skills class, disability information, VA assistance, spiritual classes, etc.   HOMELESS SHELTERS  [facility]     [facility]   [address], [place]     [phone]              [employer] (women and children)       [address] [place], [place] [address] [phone] [email or web address] for application and process Application Required  [facility]   [address]    [place] [address]     [phone]                    [facility] [address] [place], [place] [address] [phone] application appt.) Application Required  [facility] (women only)    [address]     [place], [place] [address]     [phone]      Intake starts 6pm daily Need valid ID, SSC, & Police report [employer] [address] [place], [place] [phone] Application Required  [employer] (men only)     [address]      [place], [place]     [phone]       [facility] (Pregnant women only) [address] [place], [place] [phone]  [facility]      [address]      [place], [place] [address]     [phone]             [facility] [address] [place], [place] [phone] 90 day commitment/SA/Application process  [facility](men only)     [address]     [place], [place]     [phone]       Check-in at [facility] [address] [place], [place] [address] [phone] Men/Women/Women and Children must be there by 7 pm  [facility] [place], [place] [phone]

## 2015-07-30 NOTE — ED Notes (Signed)
In to d/c pt when he informed me that he fell on his L. Wrist last night and wanted MD to "take a look at it". Dr. Oletta Cohn notified.

## 2015-07-30 NOTE — ED Provider Notes (Addendum)
CSN: [id number]     Arrival date & time 07/30/15  0557 History   First MD Initiated Contact with Patient 07/30/15 [phone]     Chief Complaint  Patient presents with  . detox      (Consider location/radiation/quality/duration/timing/severity/associated sxs/prior Treatment) HPI Comments: Patient presents to the ER requesting detox from alcohol and cocaine. Patient reports that he has a long history of using both. He reports that he cannot go through one day without using and wants to stop. He has not homicidal or suicidal. He reports that he does get shaky and feels nauseated with headache when he doesn't drink, but he has never had a seizure or serious withdrawal symptoms.   Past Medical History  Diagnosis Date  . Hyperlipemia    Past Surgical History  Procedure Laterality Date  . Shoulder surgery Right    History reviewed. No pertinent family history. Social History  Substance Use Topics  . Smoking status: Current Every Day Smoker -- 0.75 packs/day    Types: Cigarettes  . Smokeless tobacco: None  . Alcohol Use: Yes     Comment: daily as much as he can    Review of Systems  Psychiatric/Behavioral: Negative for suicidal ideas.  All other systems reviewed and are negative.     Allergies  Review of patient's allergies indicates no known allergies.  Home Medications   Prior to Admission medications   Medication Sig Start Date End Date Taking? Authorizing Provider  cephALEXin (KEFLEX) 500 MG capsule Take 1 capsule (500 mg total) by mouth 4 (four) times daily. 07/14/14   Kristen N Ward, DO  HYDROcodone-acetaminophen (NORCO/VICODIN) 5-325 MG per tablet Take 1 tablet by mouth every 4 (four) hours as needed. 07/14/14   Kristen N Ward, DO  naproxen (NAPROSYN) 500 MG tablet Take 1 tablet (500 mg total) by mouth 2 (two) times daily with a meal. 04/21/15   Tammy Triplett, PA-C   BP 130/97 mmHg  Pulse 79  Temp(Src) 97.5 F (36.4 C) (Oral)  Resp 17  Ht  (1.676 m)  Wt 150 lb (68.04  kg)  BMI 24.22 kg/m2  SpO2 96% Physical Exam  Constitutional: He is oriented to person, place, and time. He appears well-developed and well-nourished. No distress.  HENT:  Head: Normocephalic and atraumatic.  Right Ear: Hearing normal.  Left Ear: Hearing normal.  Nose: Nose normal.  Mouth/Throat: Oropharynx is clear and moist and mucous membranes are normal.  Eyes: Conjunctivae and EOM are normal. Pupils are equal, round, and reactive to light.  Neck: Normal range of motion. Neck supple.  Cardiovascular: Regular rhythm, S1 normal and S2 normal.  Exam reveals no gallop and no friction rub.   No murmur heard. Pulmonary/Chest: Effort normal and breath sounds normal. No respiratory distress. He exhibits no tenderness.  Abdominal: Soft. Normal appearance and bowel sounds are normal. There is no hepatosplenomegaly. There is no tenderness. There is no rebound, no guarding, no tenderness at McBurney's point and negative Murphy's sign. No hernia.  Musculoskeletal: Normal range of motion.       Left wrist: He exhibits tenderness and swelling. He exhibits normal range of motion.  Neurological: He is alert and oriented to person, place, and time. He has normal strength. No cranial nerve deficit or sensory deficit. Coordination normal. GCS eye subscore is 4. GCS verbal subscore is 5. GCS motor subscore is 6.  Skin: Skin is warm, dry and intact. No rash noted. No cyanosis.     Psychiatric: He has a normal mood  and affect. His speech is normal and behavior is normal. Thought content normal.  Nursing note and vitals reviewed.   ED Course  Procedures (including critical care time) Labs Review Labs Reviewed - No data to display  Imaging Review No results found. I have personally reviewed and evaluated these images and lab results as part of my medical decision-making.   EKG Interpretation None      MDM   Final diagnoses:  None   polysubstance abuse  Presents to the emergency department  requesting detox for alcohol and cocaine. I did explain to the patient that we do not have any inpatient detox programs available. He will be given symptomatic treatment and was given a list of detox centers to contact himself for treatment.  Patient reports recent fall with complaints of left wrist pain. He does appear to be slightly intoxicated still, will perform head CT and cervical spine CT as well as wrist x-ray.    Gilda Crease, MD 07/30/15 1610  Gilda Crease, MD 07/30/15 [phone]

## 2015-11-01 ENCOUNTER — Emergency Department (HOSPITAL_COMMUNITY)
Admission: EM | Admit: 2015-11-01 | Discharge: 2015-11-01 | Disposition: A | Discharge status: Home / Self Care | Payer: Self-pay | Attending: Emergency Medicine | Admitting: Emergency Medicine

## 2015-11-01 ENCOUNTER — Encounter (HOSPITAL_COMMUNITY): Payer: Self-pay

## 2015-11-01 ENCOUNTER — Encounter (HOSPITAL_BASED_OUTPATIENT_CLINIC_OR_DEPARTMENT_OTHER): Payer: Self-pay | Admitting: *Deleted

## 2015-11-01 ENCOUNTER — Emergency Department (HOSPITAL_COMMUNITY): Payer: Self-pay

## 2015-11-01 ENCOUNTER — Other Ambulatory Visit: Payer: Self-pay | Admitting: Orthopedic Surgery

## 2015-11-01 DIAGNOSIS — S0990XA Unspecified injury of head, initial encounter: Secondary | ICD-10-CM | POA: Insufficient documentation

## 2015-11-01 DIAGNOSIS — S52501A Unspecified fracture of the lower end of right radius, initial encounter for closed fracture: Secondary | ICD-10-CM

## 2015-11-01 DIAGNOSIS — F17210 Nicotine dependence, cigarettes, uncomplicated: Secondary | ICD-10-CM | POA: Insufficient documentation

## 2015-11-01 DIAGNOSIS — S298XXA Other specified injuries of thorax, initial encounter: Secondary | ICD-10-CM

## 2015-11-01 DIAGNOSIS — Y939 Activity, unspecified: Secondary | ICD-10-CM | POA: Insufficient documentation

## 2015-11-01 DIAGNOSIS — S52571A Other intraarticular fracture of lower end of right radius, initial encounter for closed fracture: Secondary | ICD-10-CM | POA: Insufficient documentation

## 2015-11-01 DIAGNOSIS — S29001A Unspecified injury of muscle and tendon of front wall of thorax, initial encounter: Secondary | ICD-10-CM | POA: Insufficient documentation

## 2015-11-01 DIAGNOSIS — W132XXA Fall from, out of or through roof, initial encounter: Secondary | ICD-10-CM | POA: Insufficient documentation

## 2015-11-01 DIAGNOSIS — Y929 Unspecified place or not applicable: Secondary | ICD-10-CM | POA: Insufficient documentation

## 2015-11-01 DIAGNOSIS — Y999 Unspecified external cause status: Secondary | ICD-10-CM | POA: Insufficient documentation

## 2015-11-01 DIAGNOSIS — R079 Chest pain, unspecified: Secondary | ICD-10-CM | POA: Insufficient documentation

## 2015-11-01 DIAGNOSIS — E785 Hyperlipidemia, unspecified: Secondary | ICD-10-CM | POA: Insufficient documentation

## 2015-11-01 DIAGNOSIS — R0602 Shortness of breath: Secondary | ICD-10-CM | POA: Insufficient documentation

## 2015-11-01 HISTORY — DX: Unspecified fracture of the lower end of right radius, initial encounter for closed fracture: S52.501A

## 2015-11-01 MED ORDER — TETANUS-DIPHTH-ACELL PERTUSSIS 5-2.5-18.5 LF-MCG/0.5 IM SUSP
0.5000 mL | Freq: Once | INTRAMUSCULAR | Status: AC
  Administered 2015-11-01: 0.5 mL via INTRAMUSCULAR
  Filled 2015-11-01: qty 0.5

## 2015-11-01 MED ORDER — KETOROLAC TROMETHAMINE 30 MG/ML IJ SOLN
15.0000 mg | Freq: Once | INTRAMUSCULAR | Status: AC
  Administered 2015-11-01: 15 mg via INTRAVENOUS

## 2015-11-01 MED ORDER — KETOROLAC TROMETHAMINE 30 MG/ML IJ SOLN
INTRAMUSCULAR | Status: AC
  Administered 2015-11-01: 15 mg via INTRAVENOUS
  Filled 2015-11-01: qty 1

## 2015-11-01 MED ORDER — HYDROMORPHONE HCL 1 MG/ML IJ SOLN
1.0000 mg | Freq: Once | INTRAMUSCULAR | Status: AC
  Administered 2015-11-01: 1 mg via INTRAVENOUS
  Filled 2015-11-01: qty 1

## 2015-11-01 MED ORDER — OXYCODONE-ACETAMINOPHEN 5-325 MG PO TABS: 1.0000 | ORAL_TABLET | Freq: Three times a day (TID) | ORAL | Status: DC | PRN

## 2015-11-01 MED ORDER — IBUPROFEN 600 MG PO TABS: 600.0000 mg | ORAL_TABLET | Freq: Four times a day (QID) | ORAL | Status: DC | PRN

## 2015-11-01 NOTE — ED Provider Notes (Signed)
CSN: [id number]     Arrival date & time 11/01/15  1336 History   First MD Initiated Contact with Patient 11/01/15 1346     Chief Complaint  Patient presents with  . Fall    Patient is a 45 y.o. male presenting with fall. The history is provided by the patient.  Fall This is a new problem. The current episode started less than 1 hour ago. The problem occurs constantly. The problem has been gradually worsening. Associated symptoms include chest pain and shortness of breath. Pertinent negatives include no abdominal pain and no headaches. Exacerbated by: movement. The symptoms are relieved by rest.  Patient presents after fall from roof (78ft) He reports hitting his head but denies LOC, no vomiting and no current headache He did hit his nose on something, but denies any facial pain No neck or back pain No abd pain He reports chest wall pain and "got wind knocked out of me" He reports right wrist pain as well   Past Medical History  Diagnosis Date  . Hyperlipemia    Past Surgical History  Procedure Laterality Date  . Shoulder surgery Right    No family history on file. Social History  Substance Use Topics  . Smoking status: Current Every Day Smoker -- 0.75 packs/day    Types: Cigarettes  . Smokeless tobacco: None  . Alcohol Use: Yes     Comment: former     Review of Systems  Constitutional: Negative for fever.  Respiratory: Positive for shortness of breath.   Cardiovascular: Positive for chest pain.  Gastrointestinal: Negative for abdominal pain.  Musculoskeletal: Positive for arthralgias. Negative for back pain and neck pain.  Neurological: Negative for headaches.  All other systems reviewed and are negative.     Allergies  Review of patient's allergies indicates no known allergies.  Home Medications   Prior to Admission medications   Medication Sig Start Date End Date Taking? Authorizing Provider  carbamazepine (TEGRETOL) 200 MG tablet  PO QD X 1D, then   PO QD X 1D, then  QD X 1D, then  PO QD X 2D 07/30/15   Gilda Crease, MD  cephALEXin (KEFLEX) 500 MG capsule Take 1 capsule (500 mg total) by mouth 4 (four) times daily. 07/14/14   Kristen N Ward, DO  HYDROcodone-acetaminophen (NORCO/VICODIN) 5-325 MG per tablet Take 1 tablet by mouth every 4 (four) hours as needed. 07/14/14   Kristen N Ward, DO  naproxen (NAPROSYN) 500 MG tablet Take 1 tablet (500 mg total) by mouth 2 (two) times daily with a meal. 04/21/15   Tammy Triplett, PA-C  promethazine (PHENERGAN) 25 MG tablet Take 1 tablet (25 mg total) by mouth every 6 (six) hours as needed for nausea or vomiting. 07/30/15   Gilda Crease, MD   BP 126/84 mmHg  Pulse 48  Temp(Src) 97.5 F (36.4 C) (Oral)  Resp 17  Ht  (1.676 m)  Wt 68.04 kg  BMI 24.22 kg/m2  SpO2 96% Physical Exam CONSTITUTIONAL: Well developed/well nourished HEAD: Normocephalic/atraumatic EYES: EOMI/PERRL ENMT: Mucous membranes moist, dried blood to nares but no septal hematoma, no other facial injury noted, no facial tenderness/bruising/crepitus.   NECK: supple no meningeal signs SPINE/BACK:entire spine nontender, No bruising/crepitance/stepoffs noted to spine CV: S1/S2 noted, no murmurs/rubs/gallops noted Chest - mild chest wall tenderness, no crepitus LUNGS: Lungs are clear to auscultation bilaterally, no apparent distress ABDOMEN: soft, nontender, no rebound or guarding, bowel sounds noted throughout abdomen GU:no cva tenderness NEURO: Pt is awake/alert/appropriate,  moves all extremitiesx4.  No facial droop.  GCS 15 EXTREMITIES: pulses normal/equal, full ROM, tenderness/swelling to right wrist He has mild tenderness to right shoulder.  No tenderness to right forearm/hand. All other extremities/joints palpated/ranged and nontender Pelvis stable SKIN: warm, color normal PSYCH: no abnormalities of mood noted, alert and oriented to situation  ED Course  Procedures  SPLINT APPLICATION Date/Time:  3:13 PM Authorized by: Joya Gaskins Consent: Verbal consent obtained. Risks and benefits: risks, benefits and alternatives were discussed Consent given by: patient Splint applied by: NURSE Location details: RIGHT UPPER EXTREMITY Splint type: SUGARTONG Supplies used: ORTHO-GLASS Post-procedure: The splinted body part was neurovascularly unchanged following the procedure. Patient tolerance: Patient tolerated the procedure well with no immediate complications.    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 Pain in chest and right wrist. Hx: Has fallen off roof numerous times. Has had a broken clavicle repaired on right side. Patient states chest pain is from picking up and moving roof shingles. EXAM: CHEST  2 VIEW COMPARISON:  02/03/2014 right shoulder FINDINGS: Is dx Heart size is normal. Mild perihilar peribronchial thickening. There are no focal consolidations or pleural effusions. No pneumothorax or acute displaced fracture. Previous ORIF of the right clavicle. IMPRESSION: No evidence for acute  abnormality. Electronically Signed   By: Norva Pavlov M.D.   On: 11/01/2015 14:54   Dg Wrist Complete Right  11/01/2015  CLINICAL DATA:  Fall from a roof.  Right wrist pain. EXAM: RIGHT WRIST - COMPLETE 3+ VIEW COMPARISON:  07/14/2014 FINDINGS: Transverse fracture of the distal radial metaphysis, mild comminution and impaction. The fracture extends into the radiocarpal joint along the ulnar side. Unusual irregularity of the tip of the ulnar styloid compatible with nondisplaced fracture. Minimal  spurring at the first carpometacarpal articulation. IMPRESSION: 1. Mildly comminuted transverse fracture of the distal radial metaphysis with intra-articular extension along the ulnar side of the radiocarpal joint. 2. Small nondisplaced fracture of the ulnar styloid. Electronically Signed   By: Gaylyn Rong M.D.   On: 11/01/2015 14:56   I have personally reviewed and evaluated these images  results as part of my medical decision-making. 2:45 PM Pt with mechanical fall from roof He mostly complaints of right wrist pain No indication for head or spinal imaging as he does not appear distracted and he is awake/alert 2:57 PM D/w on call for hand (PA for dr Mina Marble) I have arranged for f/u, will see in office on 11/04/15 3:13 PM Pt stable Discussed followup instructions IBU/percocet ordered for homegoing Discussed strict return precautions He has no other complaints No HA No abd pain No neck or back pain No lower extremity trauma noted 3:40 PM D/W DR Mina Marble PLAN FOR OPERATIVE REPAIR THIS Monday PT AGREEABLE WITH PLAN HE WILL CALL OFFICE Monday MORNING FOR DETAILS  MDM   Final diagnoses:  Blunt chest trauma, initial encounter  Minor head injury, initial encounter  Distal radius fracture, right, closed, initial encounter    Nursing notes including past medical history and social history reviewed and considered in documentation xrays/imaging reviewed by myself and considered during evaluation  Zadie Rhineonald Ezio Wieck, MD 11/01/15 [phone]

## 2015-11-01 NOTE — ED Notes (Signed)
Pt states he is having head pain, pt has nose bleed to right nare. Bleeding is controlled at this time.

## 2015-11-01 NOTE — ED Notes (Addendum)
Pt reports fell 15 feet off of a roof and c/o pain to r wrist.  PT says hit his head but did not lose consciousness.  Reports it "knocked the breath out of me."  Pt holding wrist.  Swelling noted.  Radial pulse present, capillary refill WNL, and pt can move fingers.  Pt also has blood in r nare.  Denies any neck or back pain but c collar applied due to mechanism of injury.  Pt c/o headache, rates at 5.

## 2015-11-01 NOTE — Discharge Instructions (Signed)
CALL HAND SURGERY OFFICE [phone])  AT 8:45AM ON Monday April 24  ASK FOR ALICIA SHE WILL TELL YOU WHERE TO GO FOR SURGERY SURGERY WILL LAST AN HOUR YOU WILL NEED SOMEONE TO DRIVE YOU NOTHING TO EAT OR DRINK AFTER MIDNIGHT SUNDAY  Cast or Splint Care Casts and splints support injured limbs and keep bones from moving while they heal.  HOME CARE  Keep the cast or splint uncovered during the drying period.  A plaster cast can take 24 to 48 hours to dry.  A fiberglass cast will dry in less than 1 hour.  Do not rest the cast on anything harder than a pillow for 24 hours.  Do not put weight on your injured limb. Do not put pressure on the cast. Wait for your doctor's approval.  Keep the cast or splint dry.  Cover the cast or splint with a plastic bag during baths or wet weather.  If you have a cast over your chest and belly (trunk), take sponge baths until the cast is taken off.  If your cast gets wet, dry it with a towel or blow dryer. Use the cool setting on the blow dryer.  Keep your cast or splint clean. Wash a dirty cast with a damp cloth.  Do not put any objects under your cast or splint.  Do not scratch the skin under the cast with an object. If itching is a problem, use a blow dryer on a cool setting over the itchy area.  Do not trim or cut your cast.  Do not take out the padding from inside your cast.  Exercise your joints near the cast as told by your doctor.  Raise (elevate) your injured limb on 1 or 2 pillows for the first 1 to 3 days. GET HELP IF:  Your cast or splint cracks.  Your cast or splint is too tight or too loose.  You itch badly under the cast.  Your cast gets wet or has a soft spot.  You have a bad smell coming from the cast.  You get an object stuck under the cast.  Your skin around the cast becomes red or sore.  You have new or more pain after the cast is put on. GET HELP RIGHT AWAY IF:  You have fluid leaking through the  cast.  You cannot move your fingers or toes.  Your fingers or toes turn blue or white or are cool, painful, or puffy (swollen).  You have tingling or lose feeling (numbness) around the injured area.  You have bad pain or pressure under the cast.  You have trouble breathing or have shortness of breath.  You have chest pain.   This information is not intended to replace advice given to you by your health care provider. Make sure you discuss any questions you have with your health care provider.   Document Released: 10/29/2010 Document Revised: 03/01/2013 Document Reviewed: 01/05/2013 Elsevier Interactive Patient Education [employer]2016 [employer] Inc[employer]

## 2015-11-01 NOTE — ED Notes (Signed)
MD at bedside. 

## 2015-11-04 ENCOUNTER — Ambulatory Visit (HOSPITAL_BASED_OUTPATIENT_CLINIC_OR_DEPARTMENT_OTHER): Payer: Self-pay | Admitting: Anesthesiology

## 2015-11-04 ENCOUNTER — Encounter (HOSPITAL_BASED_OUTPATIENT_CLINIC_OR_DEPARTMENT_OTHER): Payer: Self-pay | Admitting: Anesthesiology

## 2015-11-04 ENCOUNTER — Ambulatory Visit (HOSPITAL_BASED_OUTPATIENT_CLINIC_OR_DEPARTMENT_OTHER)
Admission: RE | Admit: 2015-11-04 | Discharge: 2015-11-04 | Disposition: A | Discharge status: Home / Self Care | Payer: Self-pay | Source: Ambulatory Visit | Attending: Orthopedic Surgery | Admitting: Orthopedic Surgery

## 2015-11-04 ENCOUNTER — Encounter (HOSPITAL_BASED_OUTPATIENT_CLINIC_OR_DEPARTMENT_OTHER)
Admission: RE | Disposition: A | Discharge status: Home / Self Care | Payer: Self-pay | Source: Ambulatory Visit | Attending: Orthopedic Surgery

## 2015-11-04 DIAGNOSIS — G5601 Carpal tunnel syndrome, right upper limb: Secondary | ICD-10-CM | POA: Insufficient documentation

## 2015-11-04 DIAGNOSIS — E785 Hyperlipidemia, unspecified: Secondary | ICD-10-CM | POA: Insufficient documentation

## 2015-11-04 DIAGNOSIS — X58XXXA Exposure to other specified factors, initial encounter: Secondary | ICD-10-CM | POA: Insufficient documentation

## 2015-11-04 DIAGNOSIS — F17210 Nicotine dependence, cigarettes, uncomplicated: Secondary | ICD-10-CM | POA: Insufficient documentation

## 2015-11-04 DIAGNOSIS — S52571A Other intraarticular fracture of lower end of right radius, initial encounter for closed fracture: Secondary | ICD-10-CM | POA: Insufficient documentation

## 2015-11-04 HISTORY — PX: OPEN REDUCTION INTERNAL FIXATION (ORIF) DISTAL RADIAL FRACTURE: SHX5989

## 2015-11-04 HISTORY — DX: Unspecified fracture of the lower end of right radius, initial encounter for closed fracture: S52.501A

## 2015-11-04 HISTORY — PX: CARPAL TUNNEL RELEASE: SHX101

## 2015-11-04 SURGERY — OPEN REDUCTION INTERNAL FIXATION (ORIF) DISTAL RADIUS FRACTURE
Anesthesia: Regional | Site: Wrist | Laterality: Right

## 2015-11-04 MED ORDER — BUPIVACAINE HCL (PF) 0.25 % IJ SOLN
INTRAMUSCULAR | Status: AC
  Filled 2015-11-04: qty 30

## 2015-11-04 MED ORDER — PROPOFOL 10 MG/ML IV BOLUS
INTRAVENOUS | Status: DC | PRN
  Administered 2015-11-04: 200 mg via INTRAVENOUS

## 2015-11-04 MED ORDER — MIDAZOLAM HCL 2 MG/2ML IJ SOLN
1.0000 mg | INTRAMUSCULAR | Status: DC | PRN
  Administered 2015-11-04: 2 mg via INTRAVENOUS

## 2015-11-04 MED ORDER — HYDROMORPHONE HCL 1 MG/ML IJ SOLN
INTRAMUSCULAR | Status: AC
  Filled 2015-11-04: qty 1

## 2015-11-04 MED ORDER — DEXAMETHASONE SODIUM PHOSPHATE 4 MG/ML IJ SOLN
INTRAMUSCULAR | Status: DC | PRN
  Administered 2015-11-04: 10 mg via INTRAVENOUS

## 2015-11-04 MED ORDER — MIDAZOLAM HCL 2 MG/2ML IJ SOLN
INTRAMUSCULAR | Status: AC
  Filled 2015-11-04: qty 2

## 2015-11-04 MED ORDER — LACTATED RINGERS IV SOLN
INTRAVENOUS | Status: DC
  Administered 2015-11-04: 11:00:00 via INTRAVENOUS

## 2015-11-04 MED ORDER — FENTANYL CITRATE (PF) 100 MCG/2ML IJ SOLN
INTRAMUSCULAR | Status: AC
Start: 2015-11-04 — End: 2015-11-04
  Filled 2015-11-04: qty 2

## 2015-11-04 MED ORDER — GLYCOPYRROLATE 0.2 MG/ML IJ SOLN: 0.2000 mg | Freq: Once | INTRAMUSCULAR | Status: DC | PRN

## 2015-11-04 MED ORDER — ONDANSETRON HCL 4 MG/2ML IJ SOLN
INTRAMUSCULAR | Status: DC | PRN
  Administered 2015-11-04: 4 mg via INTRAVENOUS

## 2015-11-04 MED ORDER — PROMETHAZINE HCL 25 MG/ML IJ SOLN: 6.2500 mg | INTRAMUSCULAR | Status: DC | PRN

## 2015-11-04 MED ORDER — LABETALOL HCL 5 MG/ML IV SOLN
INTRAVENOUS | Status: AC
  Filled 2015-11-04: qty 4

## 2015-11-04 MED ORDER — LIDOCAINE HCL (CARDIAC) 20 MG/ML IV SOLN
INTRAVENOUS | Status: DC | PRN
  Administered 2015-11-04: 20 mg via INTRAVENOUS

## 2015-11-04 MED ORDER — FENTANYL CITRATE (PF) 100 MCG/2ML IJ SOLN
INTRAMUSCULAR | Status: AC
  Filled 2015-11-04: qty 2

## 2015-11-04 MED ORDER — SCOPOLAMINE 1 MG/3DAYS TD PT72: 1.0000 | MEDICATED_PATCH | Freq: Once | TRANSDERMAL | Status: DC | PRN

## 2015-11-04 MED ORDER — LIDOCAINE HCL (CARDIAC) 20 MG/ML IV SOLN
INTRAVENOUS | Status: AC
Start: 2015-11-04 — End: 2015-11-04
  Filled 2015-11-04: qty 5

## 2015-11-04 MED ORDER — LABETALOL HCL 5 MG/ML IV SOLN
INTRAVENOUS | Status: DC | PRN
  Administered 2015-11-04 (×2): 2.5 mg via INTRAVENOUS

## 2015-11-04 MED ORDER — OXYCODONE-ACETAMINOPHEN 5-325 MG PO TABS: 1.0000 | ORAL_TABLET | ORAL | Status: DC | PRN

## 2015-11-04 MED ORDER — KETOROLAC TROMETHAMINE 30 MG/ML IJ SOLN
INTRAMUSCULAR | Status: AC
  Filled 2015-11-04: qty 1

## 2015-11-04 MED ORDER — BUPIVACAINE-EPINEPHRINE (PF) 0.5% -1:200000 IJ SOLN
INTRAMUSCULAR | Status: DC | PRN
  Administered 2015-11-04: 30 mL via PERINEURAL

## 2015-11-04 MED ORDER — KETOROLAC TROMETHAMINE 30 MG/ML IJ SOLN
30.0000 mg | Freq: Once | INTRAMUSCULAR | Status: AC | PRN
  Administered 2015-11-04: 30 mg via INTRAVENOUS

## 2015-11-04 MED ORDER — HYDROMORPHONE HCL 1 MG/ML IJ SOLN
0.2500 mg | INTRAMUSCULAR | Status: DC | PRN
  Administered 2015-11-04 (×5): 0.5 mg via INTRAVENOUS

## 2015-11-04 MED ORDER — HYDROCODONE-ACETAMINOPHEN 7.5-325 MG PO TABS: 1.0000 | ORAL_TABLET | Freq: Once | ORAL | Status: DC | PRN

## 2015-11-04 MED ORDER — FENTANYL CITRATE (PF) 100 MCG/2ML IJ SOLN
50.0000 ug | INTRAMUSCULAR | Status: AC | PRN
  Administered 2015-11-04: 100 ug via INTRAVENOUS
  Administered 2015-11-04: 25 ug via INTRAVENOUS
  Administered 2015-11-04: 50 ug via INTRAVENOUS
  Administered 2015-11-04: 25 ug via INTRAVENOUS

## 2015-11-04 MED ORDER — ONDANSETRON HCL 4 MG/2ML IJ SOLN
INTRAMUSCULAR | Status: AC
  Filled 2015-11-04: qty 2

## 2015-11-04 MED ORDER — CEFAZOLIN SODIUM-DEXTROSE 2-4 GM/100ML-% IV SOLN
INTRAVENOUS | Status: AC
  Filled 2015-11-04: qty 100

## 2015-11-04 MED ORDER — CHLORHEXIDINE GLUCONATE 4 % EX LIQD: 60.0000 mL | Freq: Once | CUTANEOUS | Status: DC

## 2015-11-04 MED ORDER — DEXAMETHASONE SODIUM PHOSPHATE 10 MG/ML IJ SOLN
INTRAMUSCULAR | Status: AC
  Filled 2015-11-04: qty 1

## 2015-11-04 MED ORDER — CEFAZOLIN SODIUM-DEXTROSE 2-4 GM/100ML-% IV SOLN
2.0000 g | INTRAVENOUS | Status: AC
  Administered 2015-11-04: 2 g via INTRAVENOUS

## 2015-11-04 SURGICAL SUPPLY — 77 items
APL SKNCLS STERI-STRIP NONHPOA (GAUZE/BANDAGES/DRESSINGS) ×1
BAG DECANTER FOR FLEXI CONT (MISCELLANEOUS) IMPLANT
BANDAGE ACE 3X5.8 VEL STRL LF (GAUZE/BANDAGES/DRESSINGS) IMPLANT
BANDAGE ACE 4X5 VEL STRL LF (GAUZE/BANDAGES/DRESSINGS) ×3 IMPLANT
BENZOIN TINCTURE PRP APPL 2/3 (GAUZE/BANDAGES/DRESSINGS) ×3 IMPLANT
BIT DRILL 2 FAST STEP (BIT) ×3 IMPLANT
BIT DRILL 2.5X4 QC (BIT) ×3 IMPLANT
BLADE MINI RND TIP GREEN BEAV (BLADE) IMPLANT
BLADE SURG 15 STRL LF DISP TIS (BLADE) ×2 IMPLANT
BLADE SURG 15 STRL SS (BLADE) ×6
BNDG CMPR 9X4 STRL LF SNTH (GAUZE/BANDAGES/DRESSINGS) ×1
BNDG ESMARK 4X9 LF (GAUZE/BANDAGES/DRESSINGS) ×3 IMPLANT
BNDG GAUZE ELAST 4 BULKY (GAUZE/BANDAGES/DRESSINGS) ×6 IMPLANT
CANISTER SUCT 1200ML W/VALVE (MISCELLANEOUS) ×3 IMPLANT
CLOSURE WOUND 1/2 X4 (GAUZE/BANDAGES/DRESSINGS) ×1
CORDS BIPOLAR (ELECTRODE) ×3 IMPLANT
COVER BACK TABLE 60X90IN (DRAPES) ×3 IMPLANT
CUFF TOURNIQUET SINGLE 18IN (TOURNIQUET CUFF) ×3 IMPLANT
DECANTER SPIKE VIAL GLASS SM (MISCELLANEOUS) IMPLANT
DRAPE EXTREMITY T 121X128X90 (DRAPE) ×3 IMPLANT
DRAPE OEC MINIVIEW 54X84 (DRAPES) ×3 IMPLANT
DRAPE SURG 17X23 STRL (DRAPES) ×3 IMPLANT
DURAPREP 26ML APPLICATOR (WOUND CARE) ×3 IMPLANT
ELECT REM PT RETURN 9FT ADLT (ELECTROSURGICAL)
ELECTRODE REM PT RTRN 9FT ADLT (ELECTROSURGICAL) IMPLANT
GAUZE SPONGE 4X4 12PLY STRL (GAUZE/BANDAGES/DRESSINGS) ×3 IMPLANT
GAUZE SPONGE 4X4 16PLY XRAY LF (GAUZE/BANDAGES/DRESSINGS) IMPLANT
GAUZE XEROFORM 1X8 LF (GAUZE/BANDAGES/DRESSINGS) ×3 IMPLANT
GLOVE BIOGEL M STRL SZ7.5 (GLOVE) ×3 IMPLANT
GLOVE SURG SYN 8.0 (GLOVE) ×6 IMPLANT
GOWN STRL REUS W/ TWL LRG LVL3 (GOWN DISPOSABLE) IMPLANT
GOWN STRL REUS W/TWL LRG LVL3 (GOWN DISPOSABLE)
GOWN STRL REUS W/TWL XL LVL3 (GOWN DISPOSABLE) ×9 IMPLANT
NEEDLE HYPO 25X1 1.5 SAFETY (NEEDLE) ×3 IMPLANT
NS IRRIG 1000ML POUR BTL (IV SOLUTION) ×3 IMPLANT
PACK BASIN DAY SURGERY FS (CUSTOM PROCEDURE TRAY) ×3 IMPLANT
PAD CAST 3X4 CTTN HI CHSV (CAST SUPPLIES) ×1 IMPLANT
PAD CAST 4YDX4 CTTN HI CHSV (CAST SUPPLIES) ×1 IMPLANT
PADDING CAST ABS 4INX4YD NS (CAST SUPPLIES) ×2
PADDING CAST ABS COTTON 4X4 ST (CAST SUPPLIES) ×1 IMPLANT
PADDING CAST COTTON 3X4 STRL (CAST SUPPLIES) ×3
PADDING CAST COTTON 4X4 STRL (CAST SUPPLIES) ×3
PEG SUBCHONDRAL SMOOTH 2.0X18 (Peg) ×3 IMPLANT
PEG SUBCHONDRAL SMOOTH 2.0X20 (Peg) ×3 IMPLANT
PEG SUBCHONDRAL SMOOTH 2.0X22 (Peg) ×3 IMPLANT
PEG SUBCHONDRAL SMOOTH 2.0X24 (Peg) ×9 IMPLANT
PEG SUBCHONDRAL SMOOTH 2.0X26 (Peg) ×9 IMPLANT
PENCIL BUTTON HOLSTER BLD 10FT (ELECTRODE) IMPLANT
PLATE WIDE 28.2X62.6 LT (Plate) ×3 IMPLANT
SCREW CORT 3.5X14 LNG (Screw) ×9 IMPLANT
SHEET MEDIUM DRAPE 40X70 STRL (DRAPES) ×3 IMPLANT
SLEEVE SCD COMPRESS KNEE MED (MISCELLANEOUS) ×3 IMPLANT
SPLINT PLASTER CAST XFAST 3X15 (CAST SUPPLIES) IMPLANT
SPLINT PLASTER CAST XFAST 4X15 (CAST SUPPLIES) ×16 IMPLANT
SPLINT PLASTER XTRA FAST SET 4 (CAST SUPPLIES) ×32
SPLINT PLASTER XTRA FASTSET 3X (CAST SUPPLIES)
STOCKINETTE 4X48 STRL (DRAPES) ×6 IMPLANT
STRIP CLOSURE SKIN 1/2X4 (GAUZE/BANDAGES/DRESSINGS) ×2 IMPLANT
SUCTION FRAZIER HANDLE 10FR (MISCELLANEOUS)
SUCTION TUBE FRAZIER 10FR DISP (MISCELLANEOUS) IMPLANT
SUT CHROMIC 3 0 PS 2 (SUTURE) ×3 IMPLANT
SUT ETHILON 4 0 PS 2 18 (SUTURE) IMPLANT
SUT MERSILENE 4 0 P 3 (SUTURE) IMPLANT
SUT PROLENE 3 0 PS 2 (SUTURE) ×3 IMPLANT
SUT SILK 2 0 FS (SUTURE) IMPLANT
SUT VIC AB 0 SH 27 (SUTURE) ×3 IMPLANT
SUT VIC AB 2-0 PS2 27 (SUTURE) ×3 IMPLANT
SUT VIC AB 3-0 FS2 27 (SUTURE) IMPLANT
SUT VIC AB 4-0 RB1 18 (SUTURE) ×3 IMPLANT
SUT VICRYL RAPIDE 4-0 (SUTURE) IMPLANT
SUT VICRYL RAPIDE 4/0 PS 2 (SUTURE) IMPLANT
SYR BULB 3OZ (MISCELLANEOUS) ×3 IMPLANT
SYRINGE 10CC LL (SYRINGE) ×3 IMPLANT
TOWEL OR 17X24 6PK STRL BLUE (TOWEL DISPOSABLE) ×3 IMPLANT
TUBE CONNECTING 20'X1/4 (TUBING)
TUBE CONNECTING 20X1/4 (TUBING) IMPLANT
UNDERPAD 30X30 (UNDERPADS AND DIAPERS) ×3 IMPLANT

## 2015-11-04 NOTE — Op Note (Deleted)
NAME:  [name], Lucas              ACCOUNT NO.:  [id number]  MEDICAL RECORD NO.:  [id number]  LOCATION:                                FACILITY:  [facility]  PHYSICIAN:  Marycarmen Hagey A. Troy Kanouse, M.D.DATE OF BIRTH:  12/31/1970  DATE OF PROCEDURE:  11/04/2015 DATE OF DISCHARGE:  11/04/2015                              OPERATIVE REPORT   PREOPERATIVE DIAGNOSIS:  Complex 4-part intra-articular distal radius fracture with median nerve compression.  POSTOPERATIVE DIAGNOSIS:  Complex 4-part intra-articular distal radius fracture with median nerve compression.  PROCEDURE:  Open reduction and internal fixation, 4-part comminuted intra-articular fracture, distal radius, right side with carpal tunnel release and release of brachioradialis.  SURGEON:  Aurthur Wingerter A. Idara Woodside, M.D.  ASSISTANT:  Robert J. Dasnoit, P.A.  ANESTHESIA:  Ax block and general.  COMPLICATIONS:  No complications.  DRAINS:  No drains.  DESCRIPTION OF PROCEDURE:  The patient was taken to the operating suite after induction of adequate axillary block analgesia and then general laryngeal mask airway anesthetic.  The right upper extremity was prepped and draped in sterile fashion.  An Esmarch was used to exsanguinate the limb.  Tourniquet was then inflated to 250 mmHg.  At this point in time, incision made in the palmar aspect of the right wrist and forearm area. Skin was incised over the palpable border of the flexor carpi radialis tendon.  Skin incised sharply 6 to 8 cm.  The sheath overlying the FCR was incised.  The FCR was retracted to midline, radial artery lateral side.  The fascia was incised.  Dissection was carried down to the level of pronator quadratus.  The pronator quadratus was bilaterally stripped off the lower aspect of the distal radius.  We then did a complex 4-part intra-articular fracture of the distal radius.  We carefully released the brachioradialis off the radial styloid fragment to aid in  reduction. After this was done, a standard DVR plate was fastened to the lower aspect of distal radius of the slotted hole.  Intraoperative fluoroscopy revealed this to be inadequate with regard to _____.  We then switched to the Y plate.  It was fixed with 3 cortical screws proximally followed by the smooth pegs distally capturing both the ulnar and radial sides of the complex intra-articular fracture.  Intraoperative fluoroscopy then revealed adequate reduction in AP, lateral, and oblique view.  The wound was irrigated.  The nerve was identified in the proximal aspect wound tracing the edge of the carpal canal.  We then used a Freer elevator to create a path dorsal over the drains transverse carpal ligament.  We then divided this under direct vision from proximal to distal freeing up the nerve.  We then closed the pronator quadratus over the plate using 2-0 undyed Vicryl subcutaneously and a 3- 0 Prolene subcuticular stitch on the skin.  Steri-Strips, 4x4s fluffs, and a volar splint was applied.  The patient tolerated all procedures well in a concealed fashion.     Denea Cheaney A. Librado Guandique, M.D.     MAW/MEDQ  D:  11/04/2015  T:  11/04/2015  Job:  [id number] 

## 2015-11-04 NOTE — Anesthesia Postprocedure Evaluation (Signed)
Anesthesia Post Note  Patient: Bing MatterDouglas Buonomo  Procedure(s) Performed: Procedure(s) (LRB): OPEN REDUCTION INTERNAL FIXATION (ORIF) DISTAL RADIAL FRACTURE  (Right) CARPAL TUNNEL RELEASE (Right)  Patient location during evaluation: PACU Anesthesia Type: General and Regional Level of consciousness: awake and alert Pain management: pain level controlled Vital Signs Assessment: post-procedure vital signs reviewed and stable Respiratory status: spontaneous breathing, nonlabored ventilation and respiratory function stable Cardiovascular status: blood pressure returned to baseline and stable Postop Assessment: no signs of nausea or vomiting Anesthetic complications: no    Last Vitals:  Filed Vitals:   11/04/15 1430 11/04/15 1445  BP: 174/115   Pulse: 61 61  Temp:    Resp: 13 10    Last Pain:  Filed Vitals:   11/04/15 1446  PainSc: 9                  Kennieth RadFitzgerald, Jannet Calip E

## 2015-11-04 NOTE — Op Note (Deleted)
NAME:  Lucas [name], Lucas [name]              ACCOUNT NO.:  [id number]  MEDICAL RECORD NO.:  [id number]  LOCATION:                                FACILITY:  [facility]  PHYSICIAN:  Wyett Narine A. Khadejah Son, M.D.DATE OF BIRTH:  03/29/1971  DATE OF PROCEDURE:  11/04/2015 DATE OF DISCHARGE:  11/04/2015                              OPERATIVE REPORT   PREOPERATIVE DIAGNOSIS:  Complex 4-part intra-articular distal radius fracture with median nerve compression.  POSTOPERATIVE DIAGNOSIS:  Complex 4-part intra-articular distal radius fracture with median nerve compression.  PROCEDURE:  Open reduction and internal fixation, 4-part comminuted intra-articular fracture, distal radius, right side with carpal tunnel release and release of brachioradialis.  SURGEON:  Mykiah Schmuck A. Chanin Frumkin, M.D.  ASSISTANT:  Robert J. Dasnoit, P.A.  ANESTHESIA:  Ax block and general.  COMPLICATIONS:  No complications.  DRAINS:  No drains.  DESCRIPTION OF PROCEDURE:  The patient was taken to the operating suite after induction of adequate axillary block analgesia and then general laryngeal mask airway anesthetic.  The right upper extremity was prepped and draped in sterile fashion.  An Esmarch was used to exsanguinate the limb.  Tourniquet was then inflated to 250 mmHg.  At this point in time, incision made in the palmar aspect of the right wrist and forearm area. Skin was incised over the palpable border of the flexor carpi radialis tendon.  Skin incised sharply 6 to 8 cm.  The sheath overlying the FCR was incised.  The FCR was retracted to midline, radial artery lateral side.  The fascia was incised.  Dissection was carried down to the level of pronator quadratus.  The pronator quadratus was bilaterally stripped off the lower aspect of the distal radius.  We then did a complex 4-part intra-articular fracture of the distal radius.  We carefully released the brachioradialis off the radial styloid fragment to aid in  reduction. After this was done, a standard DVR plate was fastened to the lower aspect of distal radius of the slotted hole.  Intraoperative fluoroscopy revealed this to be inadequate with regard to _____.  We then switched to the Y plate.  It was fixed with 3 cortical screws proximally followed by the smooth pegs distally capturing both the ulnar and radial sides of the complex intra-articular fracture.  Intraoperative fluoroscopy then revealed adequate reduction in AP, lateral, and oblique view.  The wound was irrigated.  The nerve was identified in the proximal aspect wound tracing the edge of the carpal canal.  We then used a Freer elevator to create a path dorsal over the drains transverse carpal ligament.  We then divided this under direct vision from proximal to distal freeing up the nerve.  We then closed the pronator quadratus over the plate using 2-0 undyed Vicryl subcutaneously and a 3- 0 Prolene subcuticular stitch on the skin.  Steri-Strips, 4x4s fluffs, and a volar splint was applied.  The patient tolerated all procedures well in a concealed fashion.     Kaydra Borgen A. Akacia Boltz, M.D.     MAW/MEDQ  D:  11/04/2015  T:  11/04/2015  Job:  [id number] 

## 2015-11-04 NOTE — H&P (Signed)
Lucas [name] is an 45 y.o. male.   Chief Complaint: right wrist pain and deformity HPI: as above s/p fall from roof last friday  Past Medical History  Diagnosis Date  . Hyperlipemia   . Distal radius fracture, right 11/01/2015    comminuted    Past Surgical History  Procedure Laterality Date  . Shoulder surgery Right   . Orif clavicle fracture Right 02/06/2014    History reviewed. No pertinent family history. Social History:  reports that he has been smoking Cigarettes.  He has been smoking about 0.75 packs per day. He does not have any smokeless tobacco history on file. He reports that he drinks alcohol. He reports that he uses illicit drugs (Cocaine).  Allergies: No Known Allergies  Medications Prior to Admission  Medication Sig Dispense Refill  . oxyCODONE-acetaminophen (PERCOCET/ROXICET) 5-325 MG tablet Take 1 tablet by mouth every 8 (eight) hours as needed for severe pain. 15 tablet 0  . carbamazepine (TEGRETOL) 200 MG tablet 800mg  PO QD X 1D, then 600mg  PO QD X 1D, then 400mg  QD X 1D, then 200mg  PO QD X 2D 11 tablet 0  . ibuprofen (ADVIL,MOTRIN) 600 MG tablet Take 1 tablet (600 mg total) by mouth every 6 (six) hours as needed. 15 tablet 0    No results found for this or any previous visit (from the past 48 hour(s)). No results found.  Review of Systems  All other systems reviewed and are negative.   Blood pressure 148/85, pulse 64, temperature 98.1 F (36.7 C), temperature source Oral, resp. rate 11, height 5\' 7"  (1.702 m), weight 71.215 kg (157 lb), SpO2 100 %. Physical Exam  Constitutional: He is oriented to person, place, and time. He appears well-developed and well-nourished.  HENT:  Head: Normocephalic and atraumatic.  Cardiovascular: Normal rate.   Respiratory: Effort normal.  Musculoskeletal:       Right wrist: He exhibits tenderness, bony tenderness and swelling.  Intra articular right distal radius fracture with median numbness  Neurological: He is alert  and oriented to person, place, and time.  Skin: Skin is warm.  Psychiatric: He has a normal mood and affect. His behavior is normal. Judgment and thought content normal.     Assessment/Plan As above  Plan ORIF/CTR  Marlowe ShoresWEINGOLD,Finlay Godbee A, MD 11/04/2015, 11:51 AM

## 2015-11-04 NOTE — Transfer of Care (Signed)
Immediate Anesthesia Transfer of Care Note  Patient: Lucas [name]  Procedure(s) Performed: Procedure(s): OPEN REDUCTION INTERNAL FIXATION (ORIF) DISTAL RADIAL FRACTURE  (Right) CARPAL TUNNEL RELEASE (Right)  Patient Location: PACU  Anesthesia Type:General  Level of Consciousness: awake and sedated  Airway & Oxygen Therapy: Patient Spontanous Breathing and Patient connected to face mask oxygen  Post-op Assessment: Report given to RN and Post -op Vital signs reviewed and stable  Post vital signs: Reviewed and stable  Last Vitals:  Filed Vitals:   11/04/15 1200 11/04/15 1336  BP: 151/98 152/99  Pulse: 71 65  Temp:  36.7 C  Resp: 20 10    Complications: No apparent anesthesia complications

## 2015-11-04 NOTE — Discharge Instructions (Signed)
° ° °  Regional Anesthesia Blocks ° °1. Numbness or the inability to move the "blocked" extremity may last from 3-48 hours after placement. The length of time depends on the medication injected and your individual response to the medication. If the numbness is not going away after 48 hours, call your surgeon. ° °2. The extremity that is blocked will need to be protected until the numbness is gone and the  Strength has returned. Because you cannot feel it, you will need to take extra care to avoid injury. Because it may be weak, you may have difficulty moving it or using it. You may not know what position it is in without looking at it while the block is in effect. ° °3. For blocks in the legs and feet, returning to weight bearing and walking needs to be done carefully. You will need to wait until the numbness is entirely gone and the strength has returned. You should be able to move your leg and foot normally before you try and bear weight or walk. You will need someone to be with you when you first try to ensure you do not fall and possibly risk injury. ° °4. Bruising and tenderness at the needle site are common side effects and will resolve in a few days. ° °5. Persistent numbness or new problems with movement should be communicated to the surgeon or the [facility] ([phone])/ [facility] ([phone]). ° ° ° °Post Anesthesia Home Care Instructions ° °Activity: °Get plenty of rest for the remainder of the day. A responsible adult should stay with you for 24 hours following the procedure.  °For the next 24 hours, DO NOT: °-Drive a car °-Operate machinery °-Drink alcoholic beverages °-Take any medication unless instructed by your physician °-Make any legal decisions or sign important papers. ° °Meals: °Start with liquid foods such as gelatin or soup. Progress to regular foods as tolerated. Avoid greasy, spicy, heavy foods. If nausea and/or vomiting occur, drink only clear liquids until  the nausea and/or vomiting subsides. Call your physician if vomiting continues. ° °Special Instructions/Symptoms: °Your throat may feel dry or sore from the anesthesia or the breathing tube placed in your throat during surgery. If this causes discomfort, gargle with warm salt water. The discomfort should disappear within 24 hours. ° °If you had a scopolamine patch placed behind your ear for the management of post- operative nausea and/or vomiting: ° °1. The medication in the patch is effective for 72 hours, after which it should be removed.  Wrap patch in a tissue and discard in the trash. Wash hands thoroughly with soap and water. °2. You may remove the patch earlier than 72 hours if you experience unpleasant side effects which may include dry mouth, dizziness or visual disturbances. °3. Avoid touching the patch. Wash your hands with soap and water after contact with the patch. °  ° °

## 2015-11-04 NOTE — Op Note (Signed)
NAME:  Lucas [name], Lucas [name]              ACCOUNT NO.:  [id number]  MEDICAL RECORD NO.:  [id number]  LOCATION:                                FACILITY:  [facility]  PHYSICIAN:  Chitara Clonch A. Neo Yepiz, M.D.DATE OF BIRTH:  08/29/1970  DATE OF PROCEDURE:  11/04/2015 DATE OF DISCHARGE:  11/04/2015                              OPERATIVE REPORT   PREOPERATIVE DIAGNOSIS:  Complex 4-part intra-articular distal radius fracture with median nerve compression.  POSTOPERATIVE DIAGNOSIS:  Complex 4-part intra-articular distal radius fracture with median nerve compression.  PROCEDURE:  Open reduction and internal fixation, 4-part comminuted intra-articular fracture, distal radius, right side with carpal tunnel release and release of brachioradialis.  SURGEON:  Teralyn Mullins A. Devontre Siedschlag, M.D.  ASSISTANT:  Robert J. Dasnoit, P.A.  ANESTHESIA:  Ax block and general.  COMPLICATIONS:  No complications.  DRAINS:  No drains.  DESCRIPTION OF PROCEDURE:  The patient was taken to the operating suite after induction of adequate axillary block analgesia and then general laryngeal mask airway anesthetic.  The right upper extremity was prepped and draped in sterile fashion.  An Esmarch was used to exsanguinate the limb.  Tourniquet was then inflated to 250 mmHg.  At this point in time, incision made in the palmar aspect of the right wrist and forearm area. Skin was incised over the palpable border of the flexor carpi radialis tendon.  Skin incised sharply 6 to 8 cm.  The sheath overlying the FCR was incised.  The FCR was retracted to midline, radial artery lateral side.  The fascia was incised.  Dissection was carried down to the level of pronator quadratus.  The pronator quadratus was bilaterally stripped off the lower aspect of the distal radius.  We then did a complex 4-part intra-articular fracture of the distal radius.  We carefully released the brachioradialis off the radial styloid fragment to aid in  reduction. After this was done, a standard DVR plate was fastened to the lower aspect of distal radius of the slotted hole.  Intraoperative fluoroscopy revealed this to be inadequate with regard to _____.  We then switched to the Y plate.  It was fixed with 3 cortical screws proximally followed by the smooth pegs distally capturing both the ulnar and radial sides of the complex intra-articular fracture.  Intraoperative fluoroscopy then revealed adequate reduction in AP, lateral, and oblique view.  The wound was irrigated.  The nerve was identified in the proximal aspect wound tracing the edge of the carpal canal.  We then used a Freer elevator to create a path dorsal over the drains transverse carpal ligament.  We then divided this under direct vision from proximal to distal freeing up the nerve.  We then closed the pronator quadratus over the plate using 2-0 undyed Vicryl subcutaneously and a 3- 0 Prolene subcuticular stitch on the skin.  Steri-Strips, 4x4s fluffs, and a volar splint was applied.  The patient tolerated all procedures well in a concealed fashion.     Gus Littler A. Karizma Cheek, M.D.     MAW/MEDQ  D:  11/04/2015  T:  11/04/2015  Job:  [id number] 

## 2015-11-04 NOTE — Progress Notes (Signed)
Assisted Dr. Rob Fitzgerald with right, ultrasound guided, supraclavicular block. Side rails up, monitors on throughout procedure. See vital signs in flow sheet. Tolerated Procedure well. 

## 2015-11-04 NOTE — Anesthesia Preprocedure Evaluation (Addendum)
Anesthesia Evaluation  Patient identified by MRN, date of birth, ID band Patient awake    Reviewed: Allergy & Precautions, NPO status , Patient's Chart, lab work & pertinent test results  Airway Mallampati: II  TM Distance: >3 FB Neck ROM: Full    Dental  (+) Edentulous Upper, Edentulous Lower   Pulmonary Current Smoker,    breath sounds clear to auscultation       Cardiovascular negative cardio ROS   Rhythm:Regular Rate:Normal     Neuro/Psych negative neurological ROS     GI/Hepatic negative GI ROS, Neg liver ROS,   Endo/Other  negative endocrine ROS  Renal/GU negative Renal ROS     Musculoskeletal   Abdominal   Peds  Hematology negative hematology ROS (+)   Anesthesia Other Findings   Reproductive/Obstetrics                             Anesthesia Physical Anesthesia Plan  ASA: II  Anesthesia Plan: General and Regional   Post-op Pain Management: GA combined w/ Regional for post-op pain   Induction: Intravenous  Airway Management Planned: LMA  Additional Equipment:   Intra-op Plan:   Post-operative Plan: Extubation in OR  Informed Consent: I have reviewed the patients History and Physical, chart, labs and discussed the procedure including the risks, benefits and alternatives for the proposed anesthesia with the patient or authorized representative who has indicated his/her understanding and acceptance.   Dental advisory given  Plan Discussed with: CRNA  Anesthesia Plan Comments:         Anesthesia Quick Evaluation

## 2015-11-04 NOTE — Op Note (Deleted)
NAME:  Lucas [name], Savion              ACCOUNT NO.:  [id number]  MEDICAL RECORD NO.:  [id number]  LOCATION:                                FACILITY:  [facility]  PHYSICIAN:  Artist PaisMatthew A. Shonn Farruggia, M.D.DATE OF BIRTH:  01/20/1971  DATE OF PROCEDURE:  11/04/2015 DATE OF DISCHARGE:  11/04/2015                              OPERATIVE REPORT   PREOPERATIVE DIAGNOSIS:  Complex 4-part intra-articular distal radius fracture with median nerve compression.  POSTOPERATIVE DIAGNOSIS:  Complex 4-part intra-articular distal radius fracture with median nerve compression.  PROCEDURE:  Open reduction and internal fixation, 4-part comminuted intra-articular fracture, distal radius, right side with carpal tunnel release and release of brachioradialis.  SURGEON:  Artist PaisMatthew A. Mina MarbleWeingold, M.D.  ASSISTANT:  Jonni Sangerobert J. Dasnoit, P.A.  ANESTHESIA:  Ax block and general.  COMPLICATIONS:  No complications.  DRAINS:  No drains.  DESCRIPTION OF PROCEDURE:  The patient was taken to the operating suite after induction of adequate axillary block analgesia and then general laryngeal mask airway anesthetic.  The right upper extremity was prepped and draped in sterile fashion.  An Esmarch was used to exsanguinate the limb.  Tourniquet was then inflated to 250 mmHg.  At this point in time, incision made in the palmar aspect of the right wrist and forearm area. Skin was incised over the palpable border of the flexor carpi radialis tendon.  Skin incised sharply 6 to 8 cm.  The sheath overlying the FCR was incised.  The FCR was retracted to midline, radial artery lateral side.  The fascia was incised.  Dissection was carried down to the level of pronator quadratus.  The pronator quadratus was bilaterally stripped off the lower aspect of the distal radius.  We then did a complex 4-part intra-articular fracture of the distal radius.  We carefully released the brachioradialis off the radial styloid fragment to aid in  reduction. After this was done, a standard DVR plate was fastened to the lower aspect of distal radius of the slotted hole.  Intraoperative fluoroscopy revealed this to be inadequate with regard to _____.  We then switched to the Y plate.  It was fixed with 3 cortical screws proximally followed by the smooth pegs distally capturing both the ulnar and radial sides of the complex intra-articular fracture.  Intraoperative fluoroscopy then revealed adequate reduction in AP, lateral, and oblique view.  The wound was irrigated.  The nerve was identified in the proximal aspect wound tracing the edge of the carpal canal.  We then used a Freer elevator to create a path dorsal over the drains transverse carpal ligament.  We then divided this under direct vision from proximal to distal freeing up the nerve.  We then closed the pronator quadratus over the plate using 2-0 undyed Vicryl subcutaneously and a 3- 0 Prolene subcuticular stitch on the skin.  Steri-Strips, 4x4s fluffs, and a volar splint was applied.  The patient tolerated all procedures well in a concealed fashion.     Artist PaisMatthew A. Mina MarbleWeingold, M.D.     MAW/MEDQ  D:  11/04/2015  T:  11/04/2015  Job:  [id number]

## 2015-11-04 NOTE — Anesthesia Procedure Notes (Addendum)
Procedure Name: LMA Insertion Performed by: York GricePEARSON, DONNA W Pre-anesthesia Checklist: Patient identified, Emergency Drugs available, Suction available and Patient being monitored Patient Re-evaluated:Patient Re-evaluated prior to inductionOxygen Delivery Method: Circle System Utilized Preoxygenation: Pre-oxygenation with 100% oxygen Intubation Type: IV induction Ventilation: Mask ventilation without difficulty LMA: LMA inserted LMA Size: 4.0 Number of attempts: 1 Placement Confirmation: positive ETCO2 Tube secured with: Tape Dental Injury: Teeth and Oropharynx as per pre-operative assessment    Anesthesia Regional Block:  Supraclavicular block  Pre-Anesthetic Checklist: ,, timeout performed, Correct Patient, Correct Site, Correct Laterality, Correct Procedure, Correct Position, site marked, Risks and benefits discussed,  Surgical consent,  Pre-op evaluation,  At surgeon's request and post-op pain management  Laterality: Right  Prep: chloraprep       Needles:  Injection technique: Single-shot  Needle Type: Echogenic Stimulator Needle     Needle Length: 9cm 9 cm Needle Gauge: 21 and 21 G    Additional Needles:  Procedures: ultrasound guided (picture in chart) and nerve stimulator Supraclavicular block  Nerve Stimulator or Paresthesia:  Response: Deltoid, biceps, and finger flexion, 0.5 mA,   Additional Responses:   Narrative:  Start time: 11/04/2015 11:50 AM End time: 11/04/2015 11:59 AM Injection made incrementally with aspirations every 5 mL.  Performed by: Personally  Anesthesiologist: Marcene DuosFITZGERALD, Cuma Polyakov  Additional Notes: Risks, benefits and alternative to block explained extensively.  Patient tolerated procedure well, without complications.

## 2015-11-04 NOTE — Op Note (Signed)
See note [phone]

## 2015-11-05 ENCOUNTER — Encounter (HOSPITAL_BASED_OUTPATIENT_CLINIC_OR_DEPARTMENT_OTHER): Payer: Self-pay | Admitting: Orthopedic Surgery

## 2015-11-05 NOTE — Addendum Note (Signed)
Addendum  created 11/05/15 [id number] by Lance CoonWesley Skylar Priest, CRNA   Modules edited: Charges VN

## 2015-11-10 ENCOUNTER — Emergency Department (HOSPITAL_COMMUNITY)
Admission: EM | Admit: 2015-11-10 | Discharge: 2015-11-10 | Disposition: A | Discharge status: Home / Self Care | Payer: Self-pay | Attending: Emergency Medicine | Admitting: Emergency Medicine

## 2015-11-10 ENCOUNTER — Encounter (HOSPITAL_COMMUNITY): Payer: Self-pay | Admitting: Emergency Medicine

## 2015-11-10 DIAGNOSIS — F17210 Nicotine dependence, cigarettes, uncomplicated: Secondary | ICD-10-CM | POA: Insufficient documentation

## 2015-11-10 DIAGNOSIS — Z791 Long term (current) use of non-steroidal anti-inflammatories (NSAID): Secondary | ICD-10-CM | POA: Insufficient documentation

## 2015-11-10 DIAGNOSIS — E785 Hyperlipidemia, unspecified: Secondary | ICD-10-CM | POA: Insufficient documentation

## 2015-11-10 DIAGNOSIS — G8918 Other acute postprocedural pain: Secondary | ICD-10-CM | POA: Insufficient documentation

## 2015-11-10 DIAGNOSIS — Z79899 Other long term (current) drug therapy: Secondary | ICD-10-CM | POA: Insufficient documentation

## 2015-11-10 MED ORDER — HYDROCODONE-ACETAMINOPHEN 5-325 MG PO TABS
2.0000 | ORAL_TABLET | Freq: Once | ORAL | Status: AC
Start: 2015-11-10 — End: 2015-11-10
  Administered 2015-11-10: 2 via ORAL
  Filled 2015-11-10: qty 2

## 2015-11-10 MED ORDER — HYDROCODONE-ACETAMINOPHEN 5-325 MG PO TABS: 2.0000 | ORAL_TABLET | ORAL | Status: DC | PRN

## 2015-11-10 NOTE — ED Provider Notes (Signed)
CSN: [id number]     Arrival date & time 11/10/15  1016 History  By signing my name below, I, Linus GalasMaharshi Patel, attest that this documentation has been prepared under the direction and in the presence of Eber HongBrian Navid Lenzen, MD. Electronically Signed: Linus GalasMaharshi Patel, ED Scribe. 11/10/2015. 10:42 AM.   Chief Complaint  Patient presents with  . Arm Pain   The history is provided by the patient. No language interpreter was used.   HPI Comments: Lucas [name] is a 45 y.o. male who presents to the Emergency Department with no pertinent PMHx complaining of right arm pain that began 6 days ago. 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 Pt was treated by Dr. Mina MarbleWeingold by performing an open internal reduction for an comminuted distal intraarticular radial fracture. Pt presents today for an increased amounts of pain. He also reports right pinky numbness. Pt has no other complaints at this time.  Electronic medical records reviewed Past Medical History  Diagnosis Date  . Hyperlipemia   . Distal radius fracture, right 11/01/2015    comminuted   Past Surgical History  Procedure Laterality Date  . Shoulder surgery Right   . Orif clavicle fracture Right 02/06/2014  . Open reduction internal fixation (orif) distal radial fracture Right 11/04/2015    Procedure: OPEN REDUCTION INTERNAL FIXATION (ORIF) DISTAL RADIAL FRACTURE ;  Surgeon: Dairl PonderMatthew Weingold, MD;  Location: [facility];  Service: Orthopedics;  Laterality: Right;  . Carpal tunnel release Right 11/04/2015    Procedure: CARPAL TUNNEL RELEASE;  Surgeon: Dairl PonderMatthew Weingold, MD;  Location: [facility];  Service: Orthopedics;  Laterality: Right;   History reviewed. No pertinent family history. Social History  Substance Use Topics  . Smoking status: Current Every Day Smoker -- 0.75 packs/day    Types: Cigarettes  . Smokeless tobacco: None  . Alcohol Use: Yes     Comment: former     Review of Systems   Constitutional: Negative for fever.  Neurological: Positive for numbness.   Allergies  Review of patient's allergies indicates no known allergies.  Home Medications   Prior to Admission medications   Medication Sig Start Date End Date Taking? Authorizing Provider  ibuprofen (ADVIL,MOTRIN) 600 MG tablet Take 1 tablet (600 mg total) by mouth every 6 (six) hours as needed. 11/01/15  Yes Zadie Rhineonald Wickline, MD  oxyCODONE-acetaminophen (ROXICET) 5-325 MG tablet Take 1 tablet by mouth every 4 (four) hours as needed for severe pain. 11/04/15  Yes Dairl PonderMatthew Weingold, MD  HYDROcodone-acetaminophen (NORCO/VICODIN) 5-325 MG tablet Take 2 tablets by mouth every 4 (four) hours as needed. 11/10/15   Eber HongBrian Abubakar Crispo, MD   BP 158/69 mmHg  Pulse 60  Temp(Src) 97.8 F (36.6 C) (Oral)  Resp 17  Ht 5\' 6"  (1.676 m)  Wt 150 lb (68.04 kg)  BMI 24.22 kg/m2  SpO2 100%   Physical Exam  Constitutional: He appears well-developed and well-nourished.  HENT:  Head: Normocephalic and atraumatic.  Eyes: Conjunctivae are normal. Right eye exhibits no discharge. Left eye exhibits no discharge.  Pulmonary/Chest: Effort normal. No respiratory distress.  Musculoskeletal:  no elbow tenderness, increased swelling to the distal forearm, forearm compartment soft; no pain with passive ROM  Neurological: He is alert. Coordination normal.  ulnar surface of hand has been numb since surgery  Skin: Skin is warm and dry. No rash noted. He is not diaphoretic. No erythema.  Psychiatric: He has a normal mood and affect.  Nursing note and vitals  reviewed.   ED Course  Procedures  DIAGNOSTIC STUDIES: Oxygen Saturation is 100% on room air, normal by my interpretation.    COORDINATION OF CARE: 10:32 AM Will provide wound care and splint. Discussed treatment plan with pt at bedside and pt agreed to plan.  Labs Review Labs Reviewed - No data to display  Imaging Review No results found. I have personally reviewed and evaluated  these images and lab results as part of my medical decision-making.    MDM   Final diagnoses:  Post-op pain    D/w Dr. Melvyn Novas - unable to get ahold of on call physician for Dr. Mina Marble - he agrees with management as there is no sign of compartment syndrome - he has no signs of infection and no drdainage / fever / redness, foul smell / swelling after rmoving his splint / padding.  Numbness present since injury according to pa - have resplinted with sugar tong - pain m eds - encouraged close f/u though the pt is reluctant to do this citing financial reasons.  He is agreeable to my prodding to get post op wound check f/u with Dr. Mina Marble.  Stable for d/c.  Meds given in ED:  Medications  HYDROcodone-acetaminophen (NORCO/VICODIN) 5-325 MG per tablet 2 tablet (2 tablets Oral Given 11/10/15 1213)    New Prescriptions   HYDROCODONE-ACETAMINOPHEN (NORCO/VICODIN) 5-325 MG TABLET    Take 2 tablets by mouth every 4 (four) hours as needed.      Eber Hong, MD 11/10/15 [phone]

## 2015-11-10 NOTE — Discharge Instructions (Signed)
,  Please obtain all of your results from medical records or have your doctors office obtain the results - share them with your doctor - you should be seen at your doctors office in the next 2 days. Call today to arrange your follow up. Take the medications as prescribed. Please review all of the medicines and only take them if you do not have an allergy to them. Please be aware that if you are taking birth control pills, taking other prescriptions, ESPECIALLY ANTIBIOTICS may make the birth control ineffective - if this is the case, either do not engage in sexual activity or use alternative methods of birth control such as condoms until you have finished the medicine and your family doctor says it is OK to restart them. If you are on a blood thinner such as COUMADIN, be aware that any other medicine that you take may cause the coumadin to either work too much, or not enough - you should have your coumadin level rechecked in next 7 days if this is the case.  °?  °It is also a possibility that you have an allergic reaction to any of the medicines that you have been prescribed - Everybody reacts differently to medications and while MOST people have no trouble with most medicines, you may have a reaction such as nausea, vomiting, rash, swelling, shortness of breath. If this is the case, please stop taking the medicine immediately and contact your physician.  °?  °You should return to the ER if you develop severe or worsening symptoms.  ° ° °

## 2015-11-10 NOTE — ED Notes (Addendum)
Paged Dr.Weingold's office (per verbal order Dr.Miller) ; Doctor on call will be paged to ext. [phone]

## 2015-11-10 NOTE — ED Notes (Signed)
Patient had surgery for fracture to right arm on Monday. Per patient worked last 3 days and believes he has reinjured arm. Patient has splint to arm.  Per patient has not taken splint off. Increased pain in arm stated.

## 2016-01-28 ENCOUNTER — Emergency Department (HOSPITAL_COMMUNITY)
Admission: EM | Admit: 2016-01-28 | Discharge: 2016-01-28 | Disposition: A | Discharge status: Home / Self Care | Payer: Self-pay | Attending: Emergency Medicine | Admitting: Emergency Medicine

## 2016-01-28 ENCOUNTER — Encounter (HOSPITAL_COMMUNITY): Payer: Self-pay | Admitting: Emergency Medicine

## 2016-01-28 DIAGNOSIS — E785 Hyperlipidemia, unspecified: Secondary | ICD-10-CM | POA: Insufficient documentation

## 2016-01-28 DIAGNOSIS — F1110 Opioid abuse, uncomplicated: Secondary | ICD-10-CM | POA: Insufficient documentation

## 2016-01-28 DIAGNOSIS — F17210 Nicotine dependence, cigarettes, uncomplicated: Secondary | ICD-10-CM | POA: Insufficient documentation

## 2016-01-28 MED ORDER — CLONIDINE HCL 0.1 MG PO TABS: 0.1000 mg | ORAL_TABLET | Freq: Four times a day (QID) | ORAL | Status: DC | PRN

## 2016-01-28 MED ORDER — ONDANSETRON 8 MG PO TBDP: 8.0000 mg | ORAL_TABLET | Freq: Three times a day (TID) | ORAL | Status: DC | PRN

## 2016-01-28 NOTE — ED Provider Notes (Signed)
CSN: [id number]     Arrival date & time 01/28/16  [id number] History   First MD Initiated Contact with Patient 01/28/16 0445     Chief Complaint  Patient presents with  . Medical Clearance     HPI Patient presents to the emergency department with complaints of it appeared abuse and requests detox from opiates.  He states she's been addicted to opioid pain medications over the past several months.  His last use of oxycodone was today.  He denies homicidal or suicidal thoughts.  He has no other focus complaints at this time.  He denies nausea and vomiting.   Past Medical History  Diagnosis Date  . Hyperlipemia   . Distal radius fracture, right 11/01/2015    comminuted   Past Surgical History  Procedure Laterality Date  . Shoulder surgery Right   . Orif clavicle fracture Right 02/06/2014  . Open reduction internal fixation (orif) distal radial fracture Right 11/04/2015    Procedure: OPEN REDUCTION INTERNAL FIXATION (ORIF) DISTAL RADIAL FRACTURE ;  Surgeon: Dairl PonderMatthew Weingold, MD;  Location: [facility];  Service: Orthopedics;  Laterality: Right;  . Carpal tunnel release Right 11/04/2015    Procedure: CARPAL TUNNEL RELEASE;  Surgeon: Dairl PonderMatthew Weingold, MD;  Location: [facility];  Service: Orthopedics;  Laterality: Right;   History reviewed. No pertinent family history. Social History  Substance Use Topics  . Smoking status: Current Every Day Smoker -- 0.75 packs/day    Types: Cigarettes  . Smokeless tobacco: None  . Alcohol Use: Yes     Comment: former     Review of Systems  All other systems reviewed and are negative.     Allergies  Review of patient's allergies indicates no known allergies.  Home Medications   Prior to Admission medications   Medication Sig Start Date End Date Taking? Authorizing Provider  cloNIDine (CATAPRES) 0.1 MG tablet Take 1 tablet (0.1 mg total) by mouth every 6 (six) hours as needed (withdrawl symtoms). 01/28/16   Azalia BilisKevin Tyse Auriemma,  MD  ondansetron (ZOFRAN ODT) 8 MG disintegrating tablet Take 1 tablet (8 mg total) by mouth every 8 (eight) hours as needed for nausea or vomiting. 01/28/16   Azalia BilisKevin Derrill Bagnell, MD   BP 130/88 mmHg  Pulse 64  Temp(Src) 97.7 F (36.5 C)  Resp 18  Ht 5\' 6"  (1.676 m)  Wt 140 lb (63.504 kg)  BMI 22.61 kg/m2  SpO2 98% Physical Exam  Constitutional: He is oriented to person, place, and time. He appears well-developed and well-nourished.  HENT:  Head: Normocephalic and atraumatic.  Eyes: EOM are normal.  Neck: Normal range of motion.  Cardiovascular: Normal rate, regular rhythm, normal heart sounds and intact distal pulses.   Pulmonary/Chest: Effort normal and breath sounds normal. No respiratory distress.  Abdominal: Soft. He exhibits no distension. There is no tenderness.  Musculoskeletal: Normal range of motion.  Neurological: He is alert and oriented to person, place, and time.  Skin: Skin is warm and dry.  Psychiatric: He has a normal mood and affect. Judgment normal.  Nursing note and vitals reviewed.   ED Course  Procedures (including critical care time) Labs Review Labs Reviewed - No data to display  Imaging Review No results found. I have personally reviewed and evaluated these images and lab results as part of my medical decision-making.   EKG Interpretation None      MDM   Final diagnoses:  Opioid abuse    The patient presents today with narcotic abuse.  There is no indication for involuntary commitment for inpatient treatment.  I think the patient is best managed as an outpatient for his opioid abuse.  The patient will be discharged home with a prescription for clonidine and antiemitics.   Patient was ambulatory in the emergency department.  He left the emergency department without his prescriptions or his discharge instructions.     Azalia Bilis, MD 01/28/16 [phone]

## 2016-01-28 NOTE — ED Notes (Signed)
Physician in to assess pt- pt then up quickly and ambulated to the exit without further eval or without speaking

## 2016-01-28 NOTE — ED Notes (Signed)
Pt wants detox from pain pills. Pt states he used oxycodone yesterday.

## 2016-01-28 NOTE — ED Notes (Signed)
Pt ambulatory to exit without receiving his discharge paperwork

## 2016-01-28 NOTE — Discharge Instructions (Signed)
Opioid Use Disorder °Opioid use disorder is a mental disorder. It is the continued nonmedical use of opioids in spite of risks to health and well-being. Misused opioids include the street drug heroin. They also include pain medicines such as morphine, hydrocodone, oxycodone, and fentanyl. Opioids are very addictive. People who misuse opioids get an exaggerated feeling of well-being. Opioid use disorder often disrupts activities at home, work, or school. It may cause mental or physical problems.  °A family history of opioid use disorder puts you at higher risk of it. People with opioid use disorder often misuse other drugs or have mental illness such as depression, posttraumatic stress disorder, or antisocial personality disorder. They also are at risk of suicide and death from overdose. °SIGNS AND SYMPTOMS  °Signs and symptoms of opioid use disorder include: °· Use of opioids in larger amounts or over a longer period than intended. °· Unsuccessful attempts to cut down or control opioid use. °· A lot of time spent obtaining, using, or recovering from the effects of opioids. °· A strong desire or urge to use opioids (craving). °· Continued use of opioids in spite of major problems at work, school, or home because of use. °· Continued use of opioids in spite of relationship problems because of use. °· Giving up or cutting down on important life activities because of opioid use. °· Use of opioids over and over in situations when it is physically hazardous, such as driving a car. °· Continued use of opioids in spite of a physical problem that is likely related to use. Physical problems can include: °¨ Severe constipation. °¨ Poor nutrition. °¨ Infertility. °¨ Tuberculosis. °¨ Aspiration pneumonia. °¨ Infections such as human immunodeficiency virus (HIV) and hepatitis (from injecting opioids). °· Continued use of opioids in spite of a mental problem that is likely related to use. Mental problems can  include: °¨ Depression. °¨ Anxiety. °¨ Hallucinations. °¨ Sleep problems. °¨ Loss of sexual function. °· Need to use more and more opioids to get the same effect, or lessened effect over time with use of the same amount (tolerance). °· Having withdrawal symptoms when opioid use is stopped, or using opioids to reduce or avoid withdrawal symptoms. Withdrawal symptoms include: °¨ Depressed, anxious, or irritable mood. °¨ Nausea, vomiting, diarrhea, or intestinal cramping. °¨ Muscle aches or spasms. °¨ Excessive tearing or runny nose. °¨ Dilated pupils, sweating, or hairs standing on end. °¨ Yawning. °¨ Fever, raised blood pressure, or fast pulse. °¨ Restlessness or trouble sleeping. This does not apply to people taking opioids for medical reasons only. °DIAGNOSIS °Opioid use disorder is diagnosed by your health care provider. You may be asked questions about your opioid use and and how it affects your life. A physical exam may be done. A drug screen may be ordered. You may be referred to a mental health professional. The diagnosis of opioid use disorder requires at least two symptoms within 12 months. The type of opioid use disorder you have depends on the number of signs and symptoms you have. The type may be: °· Mild. Two or three signs and symptoms.    °· Moderate. Four or five signs and symptoms.   °· Severe. Six or more signs and symptoms. °TREATMENT  °Treatment is usually provided by mental health professionals with training in substance use disorders. The following options are available: °· Detoxification. This is the first step in treatment for withdrawal. It is medically supervised withdrawal with the use of medicines. These medicines lessen withdrawal symptoms. They also raise the chance   of becoming opioid free. °· Counseling, also known as talk therapy. Talk therapy addresses the reasons you use opioids. It also addresses ways to keep you from using again (relapse). The goals of talk therapy are to avoid  relapse by: °¨ Identifying and avoiding triggers for use. °¨ Finding healthy ways to cope with stress. °¨ Learning how to handle cravings. °· Support groups. Support groups provide emotional support, advice, and guidance. °· A medicine that blocks opioid receptors in your brain. This medicine can reduce opioid cravings that lead to relapse. This medicine also blocks the desired opioid effect when relapse occurs. °· Opioids that are taken by mouth in place of the misused opioid (opioid maintenance treatment). These medicines satisfy cravings but are safer than commonly misused opioids. This often is the best option for people who continue to relapse with other treatments. °HOME CARE INSTRUCTIONS  °· Take medicines only as directed by your health care provider. °· Check with your health care provider before starting new medicines. °· Keep all follow-up visits as directed by your health care provider. °SEEK MEDICAL CARE IF: °· You are not able to take your medicines as directed. °· Your symptoms get worse. °SEEK IMMEDIATE MEDICAL CARE IF: °· You have serious thoughts about hurting yourself or others. °· You may have taken an overdose of opioids. °FOR MORE INFORMATION °· National Institute on Drug Abuse: www.drugabuse.gov °· Substance Abuse and Mental Health Services Administration: www.samhsa.gov °  °This information is not intended to replace advice given to you by your health care provider. Make sure you discuss any questions you have with your health care provider. °  °Document Released: 04/26/2007 Document Revised: 07/20/2014 Document Reviewed: 07/12/2013 °Elsevier Interactive Patient Education ©2016 Elsevier Inc. °Community Resource Guide Inpatient Behavioral Health/Residential  °Substance Abuse Treatment °Adults °The United Way’s “211” is a great source of information about community services available.  Access by dialing 2-1-1 from anywhere in [place], or by website -  www.nc211.org.  ° °(Updated  07/2015) ° °Crisis Assistance °24 hours a day °  °Services Offered ° °  °Area Served  °[facility] • 24-hour crisis assistance: 800-939-5911 [place], [place]  ° [facility] • 24-hour crisis assistance:336-342-8316 [place], [place]  °[facility] ° • 24-hour crisis assistance: 336-676-6840 [place], [place] °  °[facility] • 24-hour crisis assistance; 800-256-2452 [place] °  °[facility] • 24-hour crisis response line: 877-626-1772 [place]  ° °Other Local Resources (Updated 07/2015) ° °Inpatient Behavioral Health/Residential Substance Abuse Treatment Programs °  °Services  ° ° °  °Address and Phone Number  °[facility] ° • 14-day residential rehabilitation  919-575-7928 °[address] °[place], [place]  °[facility])  ° • Detox - private pay only °• 14-day residential rehabilitation -  Medicaid, insurance, private pay only 336-784-9470, or °877-615-2722 °[address], [place], [place] [address]   °[facility] • Private Insurance only °• Multiple facilities 866-577-6868 admissions °  °[facility]) ° • 90-day program °• Must be homeless to participate ° 336-725-8389, or °800-758-6077 °[place], [place]  °[facility] ° ° ° • Private Insurance only 828-575-2701, or  °844-684-9200 °[address] °[place], [place] [address]  °[facility] ° ° ° • Must make an appointment °• Transportation is offered from [employer] on [address] °• Accepts private pay, Medicare, [place] Medicaid [phone]  °[address] [place], [place] [address]   °  [facility] • Females only °• Associated with [facility] [place] [facility] 704-333-HOPE (4673) °[address] °[place], [place] [address]  °[facility] ° • Private insurance only 336-621-3381, or °800-659-3381 °[address] °[place], [place]  °[facility] ° ° • Detox °• Residential rehabilitation °• Private insurance only °• Multiple locations 855-315-4783 admissions  °[facility] [place] ° ° • Private pay °• Private insurance 877-941-8954 °[address] °[place] [address]  °[facility] ° ° • Males only °• Fee required at time of admission [phone] °[address] °[place], [place] [address]  °[facility] ° ° • Private pay only ° [phone] °[address] °[place], [place]  °[facility]  ° • Detox - private pay, Medicaid °• Residential rehabilitation for males  - Medicare, Medicaid, insurance, private pay 336-227-7417 °[address] °, [place]   °[facility]  ° • Walk-in interviews Monday - Saturday from 8 am - 4 pm °• Individuals with legal charges are not eligible [phone] °[address] °[place], [place] [address]  °[facility]  • Must be willing to work °• Must attend Alcoholics Anonymous meetings 336-285-9073 °[address] °[place], [place]   °[facility] [facility]  ° • Faith-based program °• Private pay only 336-725-1848 °[address] °[place], [place]  ° °

## 2016-01-29 ENCOUNTER — Emergency Department (HOSPITAL_COMMUNITY)
Admission: EM | Admit: 2016-01-29 | Discharge: 2016-01-29 | Disposition: A | Discharge status: Home / Self Care | Payer: Self-pay | Attending: Emergency Medicine | Admitting: Emergency Medicine

## 2016-01-29 ENCOUNTER — Encounter (HOSPITAL_COMMUNITY): Payer: Self-pay | Admitting: *Deleted

## 2016-01-29 ENCOUNTER — Emergency Department (HOSPITAL_COMMUNITY): Payer: Self-pay

## 2016-01-29 DIAGNOSIS — S022XXA Fracture of nasal bones, initial encounter for closed fracture: Secondary | ICD-10-CM | POA: Insufficient documentation

## 2016-01-29 DIAGNOSIS — Y9339 Activity, other involving climbing, rappelling and jumping off: Secondary | ICD-10-CM | POA: Insufficient documentation

## 2016-01-29 DIAGNOSIS — S70212A Abrasion, left hip, initial encounter: Secondary | ICD-10-CM | POA: Insufficient documentation

## 2016-01-29 DIAGNOSIS — Z79899 Other long term (current) drug therapy: Secondary | ICD-10-CM | POA: Insufficient documentation

## 2016-01-29 DIAGNOSIS — Y929 Unspecified place or not applicable: Secondary | ICD-10-CM | POA: Insufficient documentation

## 2016-01-29 DIAGNOSIS — S0990XA Unspecified injury of head, initial encounter: Secondary | ICD-10-CM

## 2016-01-29 DIAGNOSIS — F17210 Nicotine dependence, cigarettes, uncomplicated: Secondary | ICD-10-CM | POA: Insufficient documentation

## 2016-01-29 DIAGNOSIS — E785 Hyperlipidemia, unspecified: Secondary | ICD-10-CM | POA: Insufficient documentation

## 2016-01-29 DIAGNOSIS — Y999 Unspecified external cause status: Secondary | ICD-10-CM | POA: Insufficient documentation

## 2016-01-29 DIAGNOSIS — X58XXXA Exposure to other specified factors, initial encounter: Secondary | ICD-10-CM | POA: Insufficient documentation

## 2016-01-29 NOTE — Discharge Instructions (Signed)
Take over the counter tylenol, as directed on packaging, as needed for discomfort. No gym or sports for the next 2 weeks, and seen in follow up by your regular medical doctor. Call your regular medical doctor today to schedule a follow up appointment within the week.  Return to the Emergency Department immediately sooner if worsening.
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
## 2016-01-29 NOTE — ED Notes (Signed)
States he may have lost consciousness for a few minutes
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
## 2016-03-07 ENCOUNTER — Emergency Department (HOSPITAL_COMMUNITY): Payer: Self-pay

## 2016-03-07 ENCOUNTER — Encounter (HOSPITAL_COMMUNITY): Payer: Self-pay

## 2016-03-07 ENCOUNTER — Emergency Department (HOSPITAL_COMMUNITY)
Admission: EM | Admit: 2016-03-07 | Discharge: 2016-03-07 | Disposition: A | Discharge status: Home / Self Care | Payer: Self-pay | Attending: Emergency Medicine | Admitting: Emergency Medicine

## 2016-03-07 DIAGNOSIS — Z8781 Personal history of (healed) traumatic fracture: Secondary | ICD-10-CM | POA: Insufficient documentation

## 2016-03-07 DIAGNOSIS — M25531 Pain in right wrist: Secondary | ICD-10-CM | POA: Insufficient documentation

## 2016-03-07 DIAGNOSIS — M25532 Pain in left wrist: Secondary | ICD-10-CM | POA: Insufficient documentation

## 2016-03-07 DIAGNOSIS — F17200 Nicotine dependence, unspecified, uncomplicated: Secondary | ICD-10-CM | POA: Insufficient documentation

## 2016-03-07 MED ORDER — AMMONIA AROMATIC IN INHA
RESPIRATORY_TRACT | Status: AC
  Filled 2016-03-07: qty 10

## 2016-03-07 NOTE — ED Provider Notes (Signed)
[facility]-EM[facility] DEPT Provider Note   CSN: [id number] Arrival date & time: 03/07/16  0242     History   Chief Complaint Chief Complaint  Patient presents with  . Wrist Pain    HPI Lucas [name] is a 45 y.o. male.  HPI Patient reported injuring his right wrist at work yesterday.  Has history of opioid abuse.  Was here recently for detox from opioids.  Unsure how he injured his wrist although he's injured both of them in the past. Past Medical History:  Diagnosis Date  . Distal radius fracture, right 11/01/2015   comminuted  . Hyperlipemia     There are no active problems to display for this patient.   Past Surgical History:  Procedure Laterality Date  . CARPAL TUNNEL RELEASE Right 11/04/2015   Procedure: CARPAL TUNNEL RELEASE;  Surgeon: Dairl PonderMatthew Weingold, MD;  Location: [facility];  Service: Orthopedics;  Laterality: Right;  . OPEN REDUCTION INTERNAL FIXATION (ORIF) DISTAL RADIAL FRACTURE Right 11/04/2015   Procedure: OPEN REDUCTION INTERNAL FIXATION (ORIF) DISTAL RADIAL FRACTURE ;  Surgeon: Dairl PonderMatthew Weingold, MD;  Location: [facility];  Service: Orthopedics;  Laterality: Right;  . ORIF CLAVICLE FRACTURE Right 02/06/2014  . SHOULDER SURGERY Right        Home Medications    Prior to Admission medications   Medication Sig Start Date End Date Taking? Authorizing Provider  cloNIDine (CATAPRES) 0.1 MG tablet Take 1 tablet (0.1 mg total) by mouth every 6 (six) hours as needed (withdrawl symtoms). 01/28/16   Azalia BilisKevin Campos, MD  ondansetron (ZOFRAN ODT) 8 MG disintegrating tablet Take 1 tablet (8 mg total) by mouth every 8 (eight) hours as needed for nausea or vomiting. 01/28/16   Azalia BilisKevin Campos, MD    Family History No family history on file.  Social History Social History  Substance Use Topics  . Smoking status: Current Every Day Smoker    Packs/day: 0.75    Types: Cigarettes  . Smokeless tobacco: Never Used  . Alcohol use No     Comment: former       Allergies   Review of patient's allergies indicates no known allergies.   Review of Systems Review of Systems  Unable to perform ROS: Other     Physical Exam Updated Vital Signs BP 112/89 (BP Location: Left Arm)   Pulse 74   Temp 98.2 F (36.8 C) (Oral)   Resp 20   Ht 5\' 6"  (1.676 m)   Wt 130 lb (59 kg)   SpO2 100%   BMI 20.98 kg/m   Physical Exam  Constitutional: He appears well-developed and well-nourished. He appears lethargic. No distress.  Patient appears to be significantly under the influence of narcotics.  HENT:  Head: Normocephalic and atraumatic.  Eyes: Pupils are equal, round, and reactive to light.  Neck: Normal range of motion.  Cardiovascular: Normal rate and intact distal pulses.   Pulmonary/Chest: No respiratory distress.  Abdominal: Soft. Normal appearance and bowel sounds are normal. He exhibits no distension. There is no tenderness. There is no guarding.  Musculoskeletal: Normal range of motion.  Neurological: He appears lethargic. No cranial nerve deficit. GCS eye subscore is 3. GCS verbal subscore is 4. GCS motor subscore is 6.  Skin: Skin is warm and dry. No rash noted.  Psychiatric: His behavior is normal. Thought content normal.  Nursing note and vitals reviewed.    ED Treatments / Results  Labs (all labs ordered are listed, but only abnormal results are displayed) Labs Reviewed -  No data to display  EKG  EKG Interpretation None       Radiology Dg Wrist Complete Left  Result Date: 03/07/2016 CLINICAL DATA:  Bilateral wrist pain after injury. EXAM: LEFT WRIST - COMPLETE 3+ VIEW COMPARISON:  07/30/2015 FINDINGS: Old healed fracture deformity of the distal left radius and ulnar styloid process. Degenerative changes in the radiocarpal and STT joints. No evidence of acute fracture or dislocation. Dorsal soft tissue swelling. No focal bone lesion or bone destruction. IMPRESSION: Old fracture deformities of the distal left radius and  ulna. Degenerative changes. No acute displaced fractures identified. Electronically Signed   By: Burman Nieves M.D.   On: 03/07/2016 03:52   Dg Wrist Complete Right  Result Date: 03/07/2016 CLINICAL DATA:  Bilateral wrist pain after an injury. EXAM: RIGHT WRIST - COMPLETE 3+ VIEW COMPARISON:  11/01/2015 FINDINGS: Postoperative changes with plate and screw fixation of an old fracture of the distal right radial metaphysis. Old fracture deformity of the ulnar styloid process. Degenerative changes in the radiocarpal, STT, and first carpometacarpal joints. Mild dorsal soft tissue swelling. No evidence of acute fracture or dislocation in the right wrist. No focal bone lesion or bone destruction. IMPRESSION: No acute bony abnormalities. Postoperative and degenerative changes in the right wrist. Electronically Signed   By: Burman Nieves M.D.   On: 03/07/2016 03:51    Procedures Procedures (including critical care time)  Medications Ordered in ED Medications - No data to display   Initial Impression / Assessment and Plan / ED Course  I have reviewed the triage vital signs and the nursing notes.  Pertinent labs & imaging results that were available during my care of the patient were reviewed by me and considered in my medical decision making (see chart for details).  Clinical Course      Final Clinical Impressions(s) / ED Diagnoses   Final diagnoses:  Pain in both wrists    New Prescriptions New Prescriptions   No medications on file     Nelva Nay, MD 03/07/16 0401

## 2016-03-07 NOTE — ED Triage Notes (Signed)
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
## 2016-04-17 ENCOUNTER — Emergency Department (HOSPITAL_COMMUNITY)
Admission: EM | Admit: 2016-04-17 | Discharge: 2016-04-17 | Disposition: A | Discharge status: Home / Self Care | Payer: Self-pay | Attending: Emergency Medicine | Admitting: Emergency Medicine

## 2016-04-17 ENCOUNTER — Encounter (HOSPITAL_COMMUNITY): Payer: Self-pay | Admitting: *Deleted

## 2016-04-17 DIAGNOSIS — F1010 Alcohol abuse, uncomplicated: Secondary | ICD-10-CM | POA: Insufficient documentation

## 2016-04-17 DIAGNOSIS — Z79899 Other long term (current) drug therapy: Secondary | ICD-10-CM | POA: Insufficient documentation

## 2016-04-17 DIAGNOSIS — F17210 Nicotine dependence, cigarettes, uncomplicated: Secondary | ICD-10-CM | POA: Insufficient documentation

## 2016-04-17 DIAGNOSIS — F329 Major depressive disorder, single episode, unspecified: Secondary | ICD-10-CM | POA: Insufficient documentation

## 2016-04-17 DIAGNOSIS — F1110 Opioid abuse, uncomplicated: Secondary | ICD-10-CM | POA: Insufficient documentation

## 2016-04-17 DIAGNOSIS — F32A Depression, unspecified: Secondary | ICD-10-CM

## 2016-04-17 DIAGNOSIS — F1410 Cocaine abuse, uncomplicated: Secondary | ICD-10-CM | POA: Insufficient documentation

## 2016-04-17 LAB — COMPREHENSIVE METABOLIC PANEL
ALBUMIN: 3.9 g/dL (ref 3.5–5.0)
ALK PHOS: 55 U/L (ref 38–126)
ALT: 15 U/L — AB (ref 17–63)
AST: 26 U/L (ref 15–41)
Anion gap: 5 (ref 5–15)
BUN: 10 mg/dL (ref 6–20)
CALCIUM: 9 mg/dL (ref 8.9–10.3)
CO2: 27 mmol/L (ref 22–32)
CREATININE: 0.73 mg/dL (ref 0.61–1.24)
Chloride: 103 mmol/L (ref 101–111)
GFR calc Af Amer: >60 mL/min (ref >60)
GFR calc non Af Amer: >60 mL/min (ref >60)
GLUCOSE: 113 mg/dL — AB (ref 65–99)
Potassium: 3.6 mmol/L (ref 3.5–5.1)
SODIUM: 135 mmol/L (ref 135–145)
Total Bilirubin: 0.7 mg/dL (ref 0.3–1.2)
Total Protein: 6.5 g/dL (ref 6.5–8.1)

## 2016-04-17 LAB — CBC WITH DIFFERENTIAL/PLATELET
BASOS ABS: 0 10*3/uL (ref 0.0–0.1)
Basophils Relative: 1 %
Eosinophils Absolute: 0.1 10*3/uL (ref 0.0–0.7)
Eosinophils Relative: 2 %
HEMATOCRIT: 42.1 % (ref 39.0–52.0)
HEMOGLOBIN: 14.8 g/dL (ref 13.0–17.0)
LYMPHS PCT: 39 %
Lymphs Abs: 3.2 10*3/uL (ref 0.7–4.0)
MCH: 33 pg (ref 26.0–34.0)
MCHC: 35.2 g/dL (ref 30.0–36.0)
MCV: 94 fL (ref 78.0–100.0)
MONO ABS: 0.9 10*3/uL (ref 0.1–1.0)
MONOS PCT: 11 %
NEUTROS ABS: 3.9 10*3/uL (ref 1.7–7.7)
Neutrophils Relative %: 47 %
Platelets: 189 10*3/uL (ref 150–400)
RBC: 4.48 MIL/uL (ref 4.22–5.81)
RDW: 12.4 % (ref 11.5–15.5)
WBC: 8.1 10*3/uL (ref 4.0–10.5)

## 2016-04-17 LAB — RAPID URINE DRUG SCREEN, HOSP PERFORMED
Amphetamines: NOT DETECTED
BARBITURATES: NOT DETECTED
BENZODIAZEPINES: NOT DETECTED
Cocaine: POSITIVE — AB
Opiates: NOT DETECTED
TETRAHYDROCANNABINOL: NOT DETECTED

## 2016-04-17 LAB — ETHANOL: Alcohol, Ethyl (B): <5 mg/dL (ref <5)

## 2016-04-17 LAB — SALICYLATE LEVEL: Salicylate Lvl: <7 mg/dL (ref 2.8–30.0)

## 2016-04-17 LAB — ACETAMINOPHEN LEVEL: Acetaminophen (Tylenol), Serum: <10 ug/mL — ABNORMAL LOW (ref 10–30)

## 2016-04-17 MED ORDER — VITAMIN B-1 100 MG PO TABS: 100.0000 mg | ORAL_TABLET | Freq: Every day | ORAL | Status: DC

## 2016-04-17 MED ORDER — METHOCARBAMOL 500 MG PO TABS: 500.0000 mg | ORAL_TABLET | Freq: Three times a day (TID) | ORAL | Status: DC | PRN

## 2016-04-17 MED ORDER — THIAMINE HCL 100 MG/ML IJ SOLN: 100.0000 mg | Freq: Every day | INTRAMUSCULAR | Status: DC

## 2016-04-17 MED ORDER — LOPERAMIDE HCL 2 MG PO CAPS: 2.0000 mg | ORAL_CAPSULE | ORAL | Status: DC | PRN

## 2016-04-17 MED ORDER — DICYCLOMINE HCL 10 MG PO CAPS: 20.0000 mg | ORAL_CAPSULE | Freq: Four times a day (QID) | ORAL | Status: DC | PRN

## 2016-04-17 MED ORDER — ZOLPIDEM TARTRATE 5 MG PO TABS: 10.0000 mg | ORAL_TABLET | Freq: Every evening | ORAL | Status: DC | PRN

## 2016-04-17 MED ORDER — LORAZEPAM 1 MG PO TABS
0.0000 mg | ORAL_TABLET | Freq: Four times a day (QID) | ORAL | Status: DC
  Administered 2016-04-17: 1 mg via ORAL
  Filled 2016-04-17: qty 1

## 2016-04-17 MED ORDER — IBUPROFEN 400 MG PO TABS: 600.0000 mg | ORAL_TABLET | Freq: Three times a day (TID) | ORAL | Status: DC | PRN

## 2016-04-17 MED ORDER — ALUM & MAG HYDROXIDE-SIMETH 200-200-20 MG/5ML PO SUSP: 30.0000 mL | ORAL | Status: DC | PRN

## 2016-04-17 MED ORDER — ONDANSETRON HCL 4 MG PO TABS: 4.0000 mg | ORAL_TABLET | Freq: Three times a day (TID) | ORAL | Status: DC | PRN

## 2016-04-17 MED ORDER — LORAZEPAM 1 MG PO TABS: 0.0000 mg | ORAL_TABLET | Freq: Two times a day (BID) | ORAL | Status: DC

## 2016-04-17 MED ORDER — NICOTINE 21 MG/24HR TD PT24: 21.0000 mg | MEDICATED_PATCH | Freq: Every day | TRANSDERMAL | Status: DC

## 2016-04-17 MED ORDER — CLONIDINE HCL 0.1 MG PO TABS: 0.1000 mg | ORAL_TABLET | Freq: Every day | ORAL | Status: DC

## 2016-04-17 MED ORDER — CLONIDINE HCL 0.1 MG PO TABS
0.1000 mg | ORAL_TABLET | Freq: Four times a day (QID) | ORAL | Status: DC
  Administered 2016-04-17: 0.1 mg via ORAL
  Filled 2016-04-17: qty 1

## 2016-04-17 MED ORDER — CLONIDINE HCL 0.1 MG PO TABS: 0.1000 mg | ORAL_TABLET | ORAL | Status: DC

## 2016-04-17 MED ORDER — ACETAMINOPHEN 325 MG PO TABS: 650.0000 mg | ORAL_TABLET | ORAL | Status: DC | PRN

## 2016-04-17 MED ORDER — HYDROXYZINE HCL 25 MG PO TABS: 50.0000 mg | ORAL_TABLET | Freq: Four times a day (QID) | ORAL | Status: DC | PRN

## 2016-04-17 NOTE — ED Notes (Signed)
[facility]; She did state [facility] in [place] has a bed and pt is responsible for calling the facility to arrange placement

## 2016-04-17 NOTE — ED Notes (Signed)
TTS in process 

## 2016-04-17 NOTE — ED Notes (Signed)
Meal given to pt, pt is sleeping at this time but was notified that he had a meal tray at bedside.

## 2016-04-17 NOTE — BH Assessment (Addendum)
Tele Assessment Note   Lucas [name] is an 45 y.o. male. Presenting voluntarily to ED requesting inpatient detox services. Pt reports daily alcohol, crack-cocaine, and suboxone use. Pt reports he has not slept x4 days due to substance use. Pt reports history of detox treatment during previous incarceration. Pt denies history of violence or aggression. Pt has history of depression. Pt denies suicidal ideation. Pt denies history of suicide attempt. Pt denies history of self-harm. Pt denies history of hallucinations. Pt denies history of homicidal ideation.   Pt identified current stressor as being kicked out of his home for failure to pay make rent payments.   The following was obtained from pt's 1.17.17 encounter:  Presents to the emergency department requesting detox for alcohol and cocaine. I did explain to the patient that we do not have any inpatient detox programs available. He will be given symptomatic treatment and was given a list of detox centers to contact himself for treatment.   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 Distal radius fracture, right 11/01/2015   comminuted  . Hyperlipemia     Past Surgical History:  Procedure Laterality Date  . CARPAL TUNNEL RELEASE Right 11/04/2015   Procedure: CARPAL TUNNEL RELEASE;  Surgeon: Dairl PonderMatthew Weingold, MD;  Location: [facility];  Service: Orthopedics;  Laterality: Right;  . OPEN REDUCTION INTERNAL FIXATION (ORIF) DISTAL RADIAL FRACTURE Right 11/04/2015   Procedure: OPEN REDUCTION INTERNAL FIXATION (ORIF) DISTAL RADIAL FRACTURE ;  Surgeon: Dairl PonderMatthew Weingold, MD;  Location: [facility];  Service: Orthopedics;  Laterality: Right;  . ORIF CLAVICLE FRACTURE Right 02/06/2014  . SHOULDER SURGERY Right     Family History: History reviewed. No pertinent family  history.  Social History:  reports that he has been smoking Cigarettes.  He has been smoking about 0.75 packs per day. He has never used smokeless tobacco. He reports that he does not drink alcohol or use drugs.  Additional Social History:  Alcohol / Drug Use Pain Medications: No abuse reported Prescriptions: No abuse reported  Over the Counter: No abuse reported History of alcohol / drug use?: Yes Longest period of sobriety (when/how long): 6 hours within the last three years Substance #1 Name of Substance 1: Alcohol 1 - Age of First Use: " a few months" 1 - Amount (size/oz): one 6 pack of beer 1 - Frequency: daily 1 - Last Use / Amount: PTA/ 1 sox acl pf beer Substance #2 Name of Substance 2: Cack-Cocaine 2 - Age of First Use: NOt Reported 2 - Amount (size/oz): $100 worth 2 - Frequency: daily 2 - Duration: ongoing 2 - Last Use / Amount: PTA  CIWA: CIWA-Ar BP: 123/86 Pulse Rate: 88 Nausea and Vomiting: mild nausea with no vomiting Tactile Disturbances: none Tremor: no tremor Auditory Disturbances: not present Paroxysmal Sweats: no sweat visible Visual Disturbances: not present Anxiety: two Headache, Fullness in Head: moderate Agitation: somewhat more than normal activity Orientation and Clouding of Sensorium: cannot do serial additions or is uncertain about date CIWA-Ar Total: 8 COWS:    PATIENT STRENGTHS: (choose at least two) Average or above average intelligence Motivation for treatment/growth  Allergies: No Known Allergies  Home Medications:  (Not in a hospital admission)  OB/GYN Status:  No LMP for male patient.  General Assessment Data Location of Assessment: [facility] TTS Assessment: In system Is this a Tele or Face-to-Face  Assessment?: Tele Assessment Is this an Initial Assessment or a Re-assessment for this encounter?: Initial Assessment Marital status: Married Is patient pregnant?: No Pregnancy Status: No Living Arrangements: Other (Comment)  (Homeless) Can pt return to current living arrangement?:  (Pt homeless) Admission Status: Voluntary Is patient capable of signing voluntary admission?: Yes Referral Source: Self/Family/Friend Insurance type: Self-Pay     Crisis Care Plan Living Arrangements: Other (Comment) (Homeless) Name of Psychiatrist: None Name of Therapist: None  Education Status Is patient currently in school?: No Highest grade of school patient has completed: 7th grade  Risk to self with the past 6 months Suicidal Ideation: No Has patient been a risk to self within the past 6 months prior to admission? : No Suicidal Intent: No Has patient had any suicidal intent within the past 6 months prior to admission? : No Is patient at risk for suicide?: No Suicidal Plan?: No Has patient had any suicidal plan within the past 6 months prior to admission? : No Access to Means: No What has been your use of drugs/alcohol within the last 12 months?: Pt reports daily use of alcohol, crack-cocaine and suboxone Previous Attempts/Gestures: No Intentional Self Injurious Behavior: None Family Suicide History: Yes (brother) Recent stressful life event(s):  (h) Persecutory voices/beliefs?: No Depression: Yes Depression Symptoms: Guilt Substance abuse history and/or treatment for substance abuse?: Yes Suicide prevention information given to non-admitted patients: Yes  Risk to Others within the past 6 months Homicidal Ideation: No Does patient have any lifetime risk of violence toward others beyond the six months prior to admission? : No Thoughts of Harm to Others: No Current Homicidal Intent: No Current Homicidal Plan: No Access to Homicidal Means: No History of harm to others?: No Assessment of Violence: None Noted Does patient have access to weapons?: No Criminal Charges Pending?: No Does patient have a court date: No Is patient on probation?: No  Psychosis Hallucinations: None noted Delusions: None  noted  Mental Status Report Appearance/Hygiene: Unremarkable Eye Contact: Poor (pt drowsy) Motor Activity: Unremarkable Speech: Logical/coherent, Soft Level of Consciousness: Alert Mood: Anxious, Depressed Affect: Other (Comment) (Mood Congruent) Anxiety Level: Minimal Thought Processes: Coherent, Relevant Judgement: Unimpaired Orientation: Person, Place, Situation Obsessive Compulsive Thoughts/Behaviors: None  Cognitive Functioning Concentration: Decreased Memory: Recent Intact, Remote Intact IQ: Average Insight: Good Impulse Control: Fair Appetite: Poor Weight Loss: 100 ("over the last year") Weight Gain: 0 Sleep: Decreased Total Hours of Sleep: 0 (none within last four days due to substance use)  ADLScreening [facility] Assessment Services) Patient's cognitive ability adequate to safely complete daily activities?: Yes Patient able to express need for assistance with ADLs?: Yes Independently performs ADLs?: Yes (appropriate for developmental age)  Prior Inpatient Therapy Prior Inpatient Therapy: Yes Prior Therapy Dates: 07/2015 Prior Therapy Facilty/Provider(s): "[facility]" Reason for Treatment: SA  Prior Outpatient Therapy Prior Outpatient Therapy: No Does patient have an ACCT team?: No Does patient have Intensive In-House Services?  : No Does patient have [facility] services? : No Does patient have P4CC services?: No  ADL Screening (condition at time of admission) Patient's cognitive ability adequate to safely complete daily activities?: Yes Is the patient deaf or have difficulty hearing?: No Does the patient have difficulty seeing, even when wearing glasses/contacts?: No Does the patient have difficulty concentrating, remembering, or making decisions?: Yes Patient able to express need for assistance with ADLs?: Yes Does the patient have difficulty dressing or bathing?: No Independently performs ADLs?: Yes (appropriate for developmental age) Does the patient have  difficulty walking or climbing  stairs?: No Weakness of Legs: None Weakness of Arms/Hands: None  Home Assistive Devices/Equipment Home Assistive Devices/Equipment: None  Therapy Consults (therapy consults require a physician order) PT Evaluation Needed: No OT Evalulation Needed: No SLP Evaluation Needed: No Abuse/Neglect Assessment (Assessment to be complete while patient is alone) Physical Abuse: Denies Verbal Abuse: Denies Sexual Abuse: Denies Exploitation of patient/patient's resources: Denies Self-Neglect: Denies Values / Beliefs Cultural Requests During Hospitalization: None Spiritual Requests During Hospitalization: None Consults Spiritual Care Consult Needed: No Social Work 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 Clinician informed EDP Dr.Knapp of pt disposition. Pt contacted [facility]) and verified bed availability. Clinician informed pt RN of pt disposition and [facility] bed availability. RN to provide pt with outpatient resources including [facility] contact information.  Disposition Initial Assessment Completed for this Encounter: Yes Disposition of Patient: Other dispositions Other disposition(s): Other (Comment) (Pending Psychiatirc Recommendation)  Lucas [name] 04/17/2016 6:27 AM

## 2016-04-17 NOTE — ED Triage Notes (Signed)
Pt states he wants to get detox from crack/cocaine and alcohol, last use of both x 3 hours ago

## 2016-04-17 NOTE — ED Notes (Signed)
Pt made aware to return if symptoms worsen or if any life threatening symptoms occur.   

## 2016-04-17 NOTE — ED Provider Notes (Signed)
[facility] Provider Note   CSN: [id number] Arrival date & time: 04/17/16  0424  Time seen 04:39 AM   History   Chief Complaint Chief Complaint  Patient presents with  . detox    HPI Lucas [name] is a 45 y.o. male.  HPI patient states for the past 3 years he has been abusing alcohol, cocaine, and Suboxone. He states he has been using daily. He states he drinks 2-5 sixteen ounces of cans of beer daily. He states he mainly drinks when he is coming off the cocaine. He states he uses cocaine daily. He states he takes Suboxone 5-6 days a week in the morning. He states he is not prescribed the Suboxone. He states his last detox was in January. He went to a halfway house and was only sober 2-3 days before he left. He states he's depressed however he denies suicidal or homicidal ideation. He states he would never physically harm himself because his brother committed suicide in 1985 by shooting himself in the chest. Patient also relays he became homeless today when he could not pay his rent at his boarding house. He states he has hidden his clothes in the woods. Patient last used cocaine and alcohol two hours ago. His last Suboxone was a couple of days ago.    PCP none Psych none  Past Medical History:  Diagnosis Date  . Distal radius fracture, right 11/01/2015   comminuted  . Hyperlipemia     There are no active problems to display for this patient.   Past Surgical History:  Procedure Laterality Date  . CARPAL TUNNEL RELEASE Right 11/04/2015   Procedure: CARPAL TUNNEL RELEASE;  Surgeon: Dairl Ponder, MD;  Location: [facility];  Service: Orthopedics;  Laterality: Right;  . OPEN REDUCTION INTERNAL FIXATION (ORIF) DISTAL RADIAL FRACTURE Right 11/04/2015   Procedure: OPEN REDUCTION INTERNAL FIXATION (ORIF) DISTAL RADIAL FRACTURE ;  Surgeon: Dairl Ponder, MD;  Location: [facility];  Service: Orthopedics;  Laterality: Right;  . ORIF CLAVICLE  FRACTURE Right 02/06/2014  . SHOULDER SURGERY Right        Home Medications    Prior to Admission medications   Medication Sig Start Date End Date Taking? Authorizing Provider  cloNIDine (CATAPRES) 0.1 MG tablet Take 1 tablet (0.1 mg total) by mouth every 6 (six) hours as needed (withdrawl symtoms). 01/28/16   Azalia Bilis, MD  ondansetron (ZOFRAN ODT) 8 MG disintegrating tablet Take 1 tablet (8 mg total) by mouth every 8 (eight) hours as needed for nausea or vomiting. 01/28/16   Azalia Bilis, MD    Family History History reviewed. No pertinent family history.  Social History Social History  Substance Use Topics  . Smoking status: Current Every Day Smoker    Packs/day: 0.75    Types: Cigarettes  . Smokeless tobacco: Never Used  . 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   Review of Systems Review of Systems  All other systems reviewed and are negative.    Physical Exam Updated Vital Signs BP 116/74 (BP Location: Left Arm)   Pulse (!) 58   Temp 97.7 F (36.5 C) (Oral)   Resp 18   Ht 5\' 6"  (1.676 m)   Wt 130 lb (59 kg)   SpO2 97%   BMI 20.98 kg/m   Vital signs  normal    Physical Exam  Constitutional: He is oriented to person, place, and time.  Non-toxic appearance. He does not appear ill. No distress.  Frail male  HENT:  Head: Normocephalic and atraumatic.  Right Ear: External ear normal.  Left Ear: External ear normal.  Nose: Nose normal. No mucosal edema or rhinorrhea.  Mouth/Throat: Oropharynx is clear and moist and mucous membranes are normal. No dental abscesses or uvula swelling.  Eyes: Conjunctivae and EOM are normal. Pupils are equal, round, and reactive to light.  Neck: Normal range of motion and full passive range of motion without pain. Neck supple.  Cardiovascular: Normal rate, regular rhythm and  normal heart sounds.  Exam reveals no gallop and no friction rub.   No murmur heard. Pulmonary/Chest: Effort normal and breath sounds normal. No respiratory distress. He has no wheezes. He has no rhonchi. He has no rales. He exhibits no tenderness and no crepitus.  Abdominal: Soft. Normal appearance and bowel sounds are normal. He exhibits no distension. There is no tenderness. There is no rebound and no guarding.  Musculoskeletal: Normal range of motion. He exhibits no edema or tenderness.  Moves all extremities well.   Neurological: He is alert and oriented to person, place, and time. He has normal strength. No cranial nerve deficit.  Skin: Skin is warm, dry and intact. No rash noted. No erythema. No pallor.  Psychiatric: His mood appears anxious. His speech is rapid and/or pressured. He is agitated. He expresses no homicidal and no suicidal ideation. He expresses no suicidal plans and no homicidal plans.  Nursing note and vitals reviewed.    ED Treatments / Results  Labs (all labs ordered are listed, but only abnormal results are displayed) Results for orders placed or performed during the hospital encounter of 04/17/16  Comprehensive metabolic panel  Result Value Ref Range   Sodium 135 135 - 145 mmol/L   Potassium 3.6 3.5 - 5.1 mmol/L   Chloride 103 101 - 111 mmol/L   CO2 27 22 - 32 mmol/L   Glucose, Bld 113 (H) 65 - 99 mg/dL   BUN 10 6 - 20 mg/dL   Creatinine, Ser 1.61 0.61 - 1.24 mg/dL   Calcium 9.0 8.9 - 09.6 mg/dL   Total Protein 6.5 6.5 - 8.1 g/dL   Albumin 3.9 3.5 - 5.0 g/dL   AST 26 15 - 41 U/L   ALT 15 (L) 17 - 63 U/L   Alkaline Phosphatase 55 38 - 126 U/L   Total Bilirubin 0.7 0.3 - 1.2 mg/dL   GFR calc non Af Amer >60 >60 mL/min   GFR calc Af Amer >60 >60 mL/min   Anion gap 5 5 - 15  Ethanol  Result Value Ref Range   Alcohol, Ethyl (B) <5 <5 mg/dL  Acetaminophen level  Result Value Ref Range   Acetaminophen (Tylenol), Serum <10 (L) 10 - 30 ug/mL  Salicylate  level  Result Value Ref Range   Salicylate Lvl <7.0 2.8 - 30.0 mg/dL  CBC with Differential  Result Value Ref Range   WBC 8.1 4.0 - 10.5 K/uL   RBC 4.48 4.22 - 5.81 MIL/uL   Hemoglobin 14.8 13.0 - 17.0 g/dL   HCT 04.5 40.9 - 81.1 %   MCV 94.0 78.0 - 100.0 fL   MCH 33.0 26.0 - 34.0 pg   MCHC 35.2 30.0 - 36.0 g/dL   RDW 91.4 78.2 - 95.6 %   Platelets 189 150 - 400 K/uL   Neutrophils Relative %  47 %   Neutro Abs 3.9 1.7 - 7.7 K/uL   Lymphocytes Relative 39 %   Lymphs Abs 3.2 0.7 - 4.0 K/uL   Monocytes Relative 11 %   Monocytes Absolute 0.9 0.1 - 1.0 K/uL   Eosinophils Relative 2 %   Eosinophils Absolute 0.1 0.0 - 0.7 K/uL   Basophils Relative 1 %   Basophils Absolute 0.0 0.0 - 0.1 K/uL   Laboratory interpretation all normal except UDS still not obtained.      Procedures Procedures (including critical care time)  Medications Ordered in ED Medications  acetaminophen (TYLENOL) tablet 650 mg (not administered)  ibuprofen (ADVIL,MOTRIN) tablet 600 mg (not administered)  zolpidem (AMBIEN) tablet 10 mg (not administered)  nicotine (NICODERM CQ - dosed in mg/24 hours) patch 21 mg (not administered)  ondansetron (ZOFRAN) tablet 4 mg (not administered)  alum & mag hydroxide-simeth (MAALOX/MYLANTA) 200-200-20 MG/5ML suspension 30 mL (not administered)  LORazepam (ATIVAN) tablet 0-4 mg (not administered)    Followed by  LORazepam (ATIVAN) tablet 0-4 mg (not administered)  thiamine (VITAMIN B-1) tablet 100 mg (not administered)    Or  thiamine (B-1) injection 100 mg (not administered)  dicyclomine (BENTYL) tablet 20 mg (not administered)  hydrOXYzine (ATARAX/VISTARIL) tablet 50 mg (not administered)  loperamide (IMODIUM) capsule 2-4 mg (not administered)  methocarbamol (ROBAXIN) tablet 500 mg (not administered)  cloNIDine (CATAPRES) tablet 0.1 mg (not administered)    Followed by  cloNIDine (CATAPRES) tablet 0.1 mg (not administered)    Followed by  cloNIDine (CATAPRES) tablet  0.1 mg (not administered)     Initial Impression / Assessment and Plan / ED Course  I have reviewed the triage vital signs and the nursing notes.  Pertinent labs & imaging results that were available during my care of the patient were reviewed by me and considered in my medical decision making (see chart for details).  Clinical Course   Psychiatric medical clearance labs ordered.  05:50 AM psych holding orders including narcotic withdrawal and CIWA ordered.  Still waiting on Urine to be obtained.   05:55 AM Fatima, TTS questioning consult. Asked to evaluate patient b/o his depression and addictions.   06:19 AM Margaretmary LombardFatima, TTS states he doesn't meet inpatient criteria for admission, will given outpatient list.   Final Clinical Impressions(s) / ED Diagnoses   Final diagnoses:  Alcohol abuse  Cocaine abuse  Narcotic abuse  Depression, unspecified depression type    Plan discharge  Devoria AlbeIva Lavine Hargrove, MD, [facility]oFACEP    Teigen Parslow, MD, [facility]P     Abagael Kramm, MD 04/17/16 [phone]

## 2016-04-17 NOTE — ED Notes (Signed)
Pt given phone to call ride 

## 2016-04-17 NOTE — Discharge Instructions (Signed)
Please follow up with the list of resources you were given to get help with your addictions.

## 2016-04-17 NOTE — BH Assessment (Signed)
Substance abuse resource list has been faxed [phone]) to be given to pt.

## 2016-06-17 ENCOUNTER — Encounter (HOSPITAL_COMMUNITY): Payer: Self-pay | Admitting: *Deleted

## 2016-06-17 ENCOUNTER — Emergency Department (HOSPITAL_COMMUNITY)
Admission: EM | Admit: 2016-06-17 | Discharge: 2016-06-17 | Disposition: A | Discharge status: Home / Self Care | Payer: Self-pay | Attending: Emergency Medicine | Admitting: Emergency Medicine

## 2016-06-17 DIAGNOSIS — F1110 Opioid abuse, uncomplicated: Secondary | ICD-10-CM | POA: Insufficient documentation

## 2016-06-17 DIAGNOSIS — F1410 Cocaine abuse, uncomplicated: Secondary | ICD-10-CM | POA: Insufficient documentation

## 2016-06-17 DIAGNOSIS — F17210 Nicotine dependence, cigarettes, uncomplicated: Secondary | ICD-10-CM | POA: Insufficient documentation

## 2016-06-17 DIAGNOSIS — F32A Depression, unspecified: Secondary | ICD-10-CM

## 2016-06-17 DIAGNOSIS — Z79899 Other long term (current) drug therapy: Secondary | ICD-10-CM | POA: Insufficient documentation

## 2016-06-17 DIAGNOSIS — F329 Major depressive disorder, single episode, unspecified: Secondary | ICD-10-CM | POA: Insufficient documentation

## 2016-06-17 NOTE — ED Triage Notes (Signed)
Pt states he wants help getting off cocaine, suboxene and alcohol; pt states his last use of cocaine was 2 hours pta; pt states he has had 1 40 oz beer and 2 16oz beers today

## 2016-06-17 NOTE — ED Notes (Signed)
Pt requesting help with "getting off drugs" states " I dont want to die from those things" denies any SI or HI, admits to using last tonight,

## 2016-06-17 NOTE — ED Provider Notes (Signed)
[facility]-EM[facility]Y DEPT Provider Note   CSN: [id number] Arrival date & time: 06/17/16  0226  Time seen 04:25 AM   History   Chief Complaint Chief Complaint  Patient presents with  . detox    HPI Bing MatterDouglas Colantonio is a 45 y.o. male.  HPI  patient is here stating he wants to "get off drugs". He states he's been abusing for about 3-1/2 years. He states he self detoxed on his own about 3 months ago when he went to his brother's house. He states he did come here and had some paperwork however when he went to [facility] he lost the paperwork and they would not admit him. He states he is doing cocaine, he takes 1 Suboxone sublingual daily. He states he is only drinking 40 ounces of beer a day. He states he is depressed however he denies suicidal or homicidal ideation. He states he's never been treated for depression and has never been on medication for it.  PCP none  Past Medical History:  Diagnosis Date  . Distal radius fracture, right 11/01/2015   comminuted  . Hyperlipemia     There are no active problems to display for this patient.   Past Surgical History:  Procedure Laterality Date  . CARPAL TUNNEL RELEASE Right 11/04/2015   Procedure: CARPAL TUNNEL RELEASE;  Surgeon: Dairl PonderMatthew Weingold, MD;  Location: [facility];  Service: Orthopedics;  Laterality: Right;  . OPEN REDUCTION INTERNAL FIXATION (ORIF) DISTAL RADIAL FRACTURE Right 11/04/2015   Procedure: OPEN REDUCTION INTERNAL FIXATION (ORIF) DISTAL RADIAL FRACTURE ;  Surgeon: Dairl PonderMatthew Weingold, MD;  Location: [facility];  Service: Orthopedics;  Laterality: Right;  . ORIF CLAVICLE FRACTURE Right 02/06/2014  . SHOULDER SURGERY Right        Home Medications    Prior to Admission medications   Medication Sig Start Date End Date Taking? Authorizing Provider  cloNIDine (CATAPRES) 0.1 MG tablet Take 1 tablet (0.1 mg total) by mouth every 6 (six) hours as needed (withdrawl symtoms). 01/28/16   Azalia BilisKevin Campos, MD    ondansetron (ZOFRAN ODT) 8 MG disintegrating tablet Take 1 tablet (8 mg total) by mouth every 8 (eight) hours as needed for nausea or vomiting. 01/28/16   Azalia BilisKevin Campos, MD    Family History History reviewed. No pertinent family history.  Social History Social History  Substance Use Topics  . Smoking status: Current Every Day Smoker    Packs/day: 0.75    Types: Cigarettes  . Smokeless tobacco: Never Used  . Alcohol use 2.4 oz/week    4 Cans of beer per week  employed in roofing   Allergies   Patient has no known allergies.   Review of Systems Review of Systems  All other systems reviewed and are negative.    Physical Exam Updated Vital Signs BP 132/84 (BP Location: Left Arm)   Pulse 86   Temp 97.5 F (36.4 C) (Oral)   Resp 16   Ht 5\' 6"  (1.676 m)   Wt 130 lb (59 kg)   SpO2 97%   BMI 20.98 kg/m   Vital signs normal    Physical Exam  Constitutional: He is oriented to person, place, and time.  Non-toxic appearance. He does not appear ill. No distress.  underweight  HENT:  Head: Normocephalic and atraumatic.  Right Ear: External ear normal.  Left Ear: External ear normal.  Nose: Nose normal. No mucosal edema or rhinorrhea.  Mouth/Throat: Oropharynx is clear and moist and mucous membranes are normal. No dental  abscesses or uvula swelling.  Eyes: Conjunctivae and EOM are normal. Pupils are equal, round, and reactive to light.  Neck: Normal range of motion and full passive range of motion without pain. Neck supple.  Cardiovascular: Normal rate, regular rhythm and normal heart sounds.  Exam reveals no gallop and no friction rub.   No murmur heard. Pulmonary/Chest: Effort normal and breath sounds normal. No respiratory distress. He has no wheezes. He has no rhonchi. He has no rales. He exhibits no tenderness and no crepitus.  Abdominal: Soft. Normal appearance and bowel sounds are normal. He exhibits no distension. There is no tenderness. There is no rebound and no  guarding.  Musculoskeletal: Normal range of motion. He exhibits no edema or tenderness.  Moves all extremities well.   Neurological: He is alert and oriented to person, place, and time. He has normal strength. No cranial nerve deficit.  Skin: Skin is warm, dry and intact. No rash noted. No erythema. No pallor.  Psychiatric: He is slowed. He expresses no homicidal and no suicidal ideation. He expresses no suicidal plans and no homicidal plans.  Mumbles, sleepy  Nursing note and vitals reviewed.    ED Treatments / Results    Procedures Procedures (including critical care time)  Medications Ordered in ED Medications - No data to display   Initial Impression / Assessment and Plan / ED Course  I have reviewed the triage vital signs and the nursing notes.  Pertinent labs & imaging results that were available during my care of the patient were reviewed by me and considered in my medical decision making (see chart for details)   .  Clinical Course    I talked to patient that unfortunately they do not admit patients for narcotic detox anymore. He was given outpatient referrals. If he should however feel like he is having suicidal or homicidal ideation he should return to the ED.   Final Clinical Impressions(s) / ED Diagnoses   Final diagnoses:  Cocaine abuse  Narcotic abuse  Depression, unspecified depression type   Plan discharge  Devoria AlbeIva Kemal Amores, MD, [facility]oFACEP     Dorota Heinrichs, MD 06/17/16 [id number]

## 2016-06-17 NOTE — Discharge Instructions (Signed)
Return to the ED if you feel you are going to purposely hurt yourself or someone else.

## 2016-07-09 ENCOUNTER — Emergency Department (HOSPITAL_COMMUNITY)
Admission: EM | Admit: 2016-07-09 | Discharge: 2016-07-09 | Disposition: A | Discharge status: Home / Self Care | Payer: Self-pay | Attending: Emergency Medicine | Admitting: Emergency Medicine

## 2016-07-09 ENCOUNTER — Encounter (HOSPITAL_COMMUNITY): Payer: Self-pay | Admitting: *Deleted

## 2016-07-09 DIAGNOSIS — F17210 Nicotine dependence, cigarettes, uncomplicated: Secondary | ICD-10-CM | POA: Insufficient documentation

## 2016-07-09 DIAGNOSIS — F1910 Other psychoactive substance abuse, uncomplicated: Secondary | ICD-10-CM

## 2016-07-09 LAB — CBC WITH DIFFERENTIAL/PLATELET
BASOS ABS: 0 10*3/uL (ref 0.0–0.1)
BASOS PCT: 1 %
Eosinophils Absolute: 0.1 10*3/uL (ref 0.0–0.7)
Eosinophils Relative: 1 %
HEMATOCRIT: 41.8 % (ref 39.0–52.0)
HEMOGLOBIN: 14.6 g/dL (ref 13.0–17.0)
Lymphocytes Relative: 34 %
Lymphs Abs: 2.8 10*3/uL (ref 0.7–4.0)
MCH: 32.4 pg (ref 26.0–34.0)
MCHC: 34.9 g/dL (ref 30.0–36.0)
MCV: 92.9 fL (ref 78.0–100.0)
MONOS PCT: 8 %
Monocytes Absolute: 0.7 10*3/uL (ref 0.1–1.0)
NEUTROS ABS: 4.5 10*3/uL (ref 1.7–7.7)
NEUTROS PCT: 56 %
Platelets: 165 10*3/uL (ref 150–400)
RBC: 4.5 MIL/uL (ref 4.22–5.81)
RDW: 12.4 % (ref 11.5–15.5)
WBC: 8.1 10*3/uL (ref 4.0–10.5)

## 2016-07-09 LAB — COMPREHENSIVE METABOLIC PANEL
ALBUMIN: 3.9 g/dL (ref 3.5–5.0)
ALK PHOS: 68 U/L (ref 38–126)
ALT: 14 U/L — AB (ref 17–63)
AST: 22 U/L (ref 15–41)
Anion gap: 7 (ref 5–15)
BILIRUBIN TOTAL: 0.4 mg/dL (ref 0.3–1.2)
BUN: 7 mg/dL (ref 6–20)
CALCIUM: 8.5 mg/dL — AB (ref 8.9–10.3)
CO2: 25 mmol/L (ref 22–32)
Chloride: 107 mmol/L (ref 101–111)
Creatinine, Ser: 0.74 mg/dL (ref 0.61–1.24)
GFR calc Af Amer: >60 mL/min (ref >60)
GFR calc non Af Amer: >60 mL/min (ref >60)
GLUCOSE: 87 mg/dL (ref 65–99)
Potassium: 3.5 mmol/L (ref 3.5–5.1)
Sodium: 139 mmol/L (ref 135–145)
TOTAL PROTEIN: 6.5 g/dL (ref 6.5–8.1)

## 2016-07-09 LAB — ACETAMINOPHEN LEVEL: Acetaminophen (Tylenol), Serum: <10 ug/mL — ABNORMAL LOW (ref 10–30)

## 2016-07-09 LAB — ETHANOL: Alcohol, Ethyl (B): 85 mg/dL — ABNORMAL HIGH (ref <5)

## 2016-07-09 LAB — SALICYLATE LEVEL: Salicylate Lvl: <7 mg/dL (ref 2.8–30.0)

## 2016-07-09 MED ORDER — CHLORDIAZEPOXIDE HCL 25 MG PO CAPS
50.0000 mg | ORAL_CAPSULE | Freq: Once | ORAL | Status: AC
  Administered 2016-07-09: 50 mg via ORAL
  Filled 2016-07-09: qty 2

## 2016-07-09 MED ORDER — ONDANSETRON 4 MG PO TBDP
4.0000 mg | ORAL_TABLET | Freq: Once | ORAL | Status: AC
Start: 2016-07-09 — End: 2016-07-09
  Administered 2016-07-09: 4 mg via ORAL
  Filled 2016-07-09: qty 1

## 2016-07-09 MED ORDER — CHLORDIAZEPOXIDE HCL 25 MG PO CAPS: ORAL_CAPSULE | ORAL | 0 refills | Status: DC

## 2016-07-09 NOTE — ED Triage Notes (Signed)
Pt states he wants help getting off cocaine, sleep aids and beer; pt states his last use was a couple of hours ago and he used cocaine, sleep aids and drank 2 beers

## 2016-07-09 NOTE — ED Provider Notes (Signed)
[facility]-EM[facility]Y DEPT Provider Note   CSN: [id number] Arrival date & time: 07/09/16  0343     History   Chief Complaint Chief Complaint  Patient presents with  . detox    HPI Lucas [name] is a 45 y.o. male.  HPI 45 year old male who presents for detox. History of polysubstance abuse. States he is interested in going for detox from alcohol and cocaine. Last use about 6-8 hours ago. Drinks daily, 2-6 beers.  No prior history of alcohol withdrawal. [facility] but unsure if he needs medical clearance or how to get there. No SI/HI or AVH. No nausea, vomiting, abd pain, dyspnea, chest pain, tremors, or LOC.  Past Medical History:  Diagnosis Date  . Distal radius fracture, right 11/01/2015   comminuted  . Hyperlipemia     There are no active problems to display for this patient.   Past Surgical History:  Procedure Laterality Date  . CARPAL TUNNEL RELEASE Right 11/04/2015   Procedure: CARPAL TUNNEL RELEASE;  Surgeon: Dairl PonderMatthew Weingold, MD;  Location: [facility];  Service: Orthopedics;  Laterality: Right;  . OPEN REDUCTION INTERNAL FIXATION (ORIF) DISTAL RADIAL FRACTURE Right 11/04/2015   Procedure: OPEN REDUCTION INTERNAL FIXATION (ORIF) DISTAL RADIAL FRACTURE ;  Surgeon: Dairl PonderMatthew Weingold, MD;  Location: [facility];  Service: Orthopedics;  Laterality: Right;  . ORIF CLAVICLE FRACTURE Right 02/06/2014  . SHOULDER SURGERY Right        Home Medications    Prior to Admission medications   Medication Sig Start Date End Date Taking? Authorizing Provider  chlordiazePOXIDE (LIBRIUM) 25 MG capsule 50mg  PO TID x 1D, then 25-50mg  PO BID X 1D, then 25-50mg  PO QD X 1D 07/09/16   Lavera Guiseana Duo Liu, MD  cloNIDine (CATAPRES) 0.1 MG tablet Take 1 tablet (0.1 mg total) by mouth every 6 (six) hours as needed (withdrawl symtoms). 01/28/16   Azalia BilisKevin Campos, MD  ondansetron (ZOFRAN ODT) 8 MG disintegrating tablet Take 1 tablet (8 mg total) by mouth every 8  (eight) hours as needed for nausea or vomiting. 01/28/16   Azalia BilisKevin Campos, MD    Family History History reviewed. No pertinent family history.  Social History Social History  Substance Use Topics  . Smoking status: Current Every Day Smoker    Packs/day: 0.75    Types: Cigarettes  . Smokeless tobacco: Never Used  . Alcohol use 2.4 oz/week    4 Cans of beer per week     Allergies   Patient has no known allergies.   Review of Systems Review of Systems 10/14 systems reviewed and are negative other than those stated in the HPI   Physical Exam Updated Vital Signs BP 130/91   Pulse 82   Temp 97.7 F (36.5 C) (Oral)   Resp 17   Ht 5\' 6"  (1.676 m)   Wt 130 lb (59 kg)   SpO2 96%   BMI 20.98 kg/m   Physical Exam Physical Exam  Nursing note and vitals reviewed. Constitutional: Well developed, well nourished, non-toxic, and in no acute distress Head: Normocephalic and atraumatic.  Mouth/Throat: Oropharynx is clear and moist.  Neck: Normal range of motion. Neck supple.  Cardiovascular: Normal rate and regular rhythm.   Pulmonary/Chest: Effort normal and breath sounds normal.  Abdominal: Soft. There is no tenderness. There is no rebound and no guarding.  Musculoskeletal: Normal range of motion.  Neurological: Alert, no facial droop, fluent speech, moves all extremities symmetrically Skin: Skin is warm and dry.  Psychiatric: Cooperative   ED Treatments / Results  Labs (all labs ordered are listed, but only abnormal results are displayed) Labs Reviewed  COMPREHENSIVE METABOLIC PANEL - Abnormal; Notable for the following:       Result Value   Calcium 8.5 (*)    ALT 14 (*)    All other components within normal limits  ACETAMINOPHEN LEVEL - Abnormal; Notable for the following:    Acetaminophen (Tylenol), Serum <10 (*)    All other components within normal limits  ETHANOL - Abnormal; Notable for the following:    Alcohol, Ethyl (B) 85 (*)    All other components within  normal limits  CBC WITH DIFFERENTIAL/PLATELET  SALICYLATE LEVEL    EKG  EKG Interpretation None       Radiology No results found.  Procedures Procedures (including critical care time)  Medications Ordered in ED Medications  chlordiazePOXIDE (LIBRIUM) capsule 50 mg (50 mg Oral Given 07/09/16 0439)  ondansetron (ZOFRAN-ODT) disintegrating tablet 4 mg (4 mg Oral Given 07/09/16 0439)     Initial Impression / Assessment and Plan / ED Course  I have reviewed the triage vital signs and the nursing notes.  Pertinent labs & imaging results that were available during my care of the patient were reviewed by me and considered in my medical decision making (see chart for details).  Clinical Course     Presenting with interest to detox from alcohol and cocaine. He is well-appearing in no acute distress with stable vital signs. No signs of alcohol withdrawal. He is given dose of Librium here. Blood work is reassuring. He has an alcohol level of 85, but clinically sober, questions appropriately, ambulating normally. We'll discharge with Librium taper. He is provided resources for detox facilities. Medically cleared to go to detox  Fi nal Clinical Impressions(s) / ED Diagnoses   Final diagnoses:  Substance abuse    New Prescriptions New Prescriptions   CHLORDIAZEPOXIDE (LIBRIUM) 25 MG CAPSULE    50mg  PO TID x 1D, then 25-50mg  PO BID X 1D, then 25-50mg  PO QD X 1D     Lavera Guiseana Duo Liu, MD 07/09/16 [phone]

## 2016-07-09 NOTE — ED Notes (Signed)
Pt given crackers, peanut butter, and soda per request.

## 2016-07-09 NOTE — Discharge Instructions (Signed)
Please return for worsening symptoms, including feeling unsafe, thoughts of wanting to hurt yourself, or any other symptoms concerning to you.  You are medically cleared today to go to detox facility. Resources are provided.

## 2016-07-18 ENCOUNTER — Emergency Department (HOSPITAL_COMMUNITY)
Admission: EM | Admit: 2016-07-18 | Discharge: 2016-07-18 | Disposition: A | Discharge status: Home / Self Care | Payer: Self-pay | Attending: Emergency Medicine | Admitting: Emergency Medicine

## 2016-07-18 ENCOUNTER — Encounter (HOSPITAL_COMMUNITY): Payer: Self-pay

## 2016-07-18 DIAGNOSIS — Z590 Homelessness: Secondary | ICD-10-CM | POA: Insufficient documentation

## 2016-07-18 DIAGNOSIS — F17210 Nicotine dependence, cigarettes, uncomplicated: Secondary | ICD-10-CM | POA: Insufficient documentation

## 2016-07-18 DIAGNOSIS — F1910 Other psychoactive substance abuse, uncomplicated: Secondary | ICD-10-CM | POA: Insufficient documentation

## 2016-07-18 DIAGNOSIS — F10920 Alcohol use, unspecified with intoxication, uncomplicated: Secondary | ICD-10-CM

## 2016-07-18 DIAGNOSIS — F10120 Alcohol abuse with intoxication, uncomplicated: Secondary | ICD-10-CM | POA: Insufficient documentation

## 2016-07-18 DIAGNOSIS — F1490 Cocaine use, unspecified, uncomplicated: Secondary | ICD-10-CM | POA: Insufficient documentation

## 2016-07-18 DIAGNOSIS — F1290 Cannabis use, unspecified, uncomplicated: Secondary | ICD-10-CM | POA: Insufficient documentation

## 2016-07-18 LAB — CBC WITH DIFFERENTIAL/PLATELET
Basophils Absolute: 0 10*3/uL (ref 0.0–0.1)
Basophils Relative: 1 %
EOS ABS: 0 10*3/uL (ref 0.0–0.7)
EOS PCT: 1 %
HCT: 44 % (ref 39.0–52.0)
Hemoglobin: 15 g/dL (ref 13.0–17.0)
LYMPHS ABS: 2.4 10*3/uL (ref 0.7–4.0)
LYMPHS PCT: 39 %
MCH: 31.9 pg (ref 26.0–34.0)
MCHC: 34.1 g/dL (ref 30.0–36.0)
MCV: 93.6 fL (ref 78.0–100.0)
MONO ABS: 0.6 10*3/uL (ref 0.1–1.0)
MONOS PCT: 9 %
Neutro Abs: 3.2 10*3/uL (ref 1.7–7.7)
Neutrophils Relative %: 50 %
PLATELETS: 229 10*3/uL (ref 150–400)
RBC: 4.7 MIL/uL (ref 4.22–5.81)
RDW: 12.5 % (ref 11.5–15.5)
WBC: 6.3 10*3/uL (ref 4.0–10.5)

## 2016-07-18 LAB — COMPREHENSIVE METABOLIC PANEL
ALT: 15 U/L — ABNORMAL LOW (ref 17–63)
ANION GAP: 8 (ref 5–15)
AST: 22 U/L (ref 15–41)
Albumin: 4.3 g/dL (ref 3.5–5.0)
Alkaline Phosphatase: 68 U/L (ref 38–126)
BUN: 7 mg/dL (ref 6–20)
CHLORIDE: 104 mmol/L (ref 101–111)
CO2: 29 mmol/L (ref 22–32)
CREATININE: 0.83 mg/dL (ref 0.61–1.24)
Calcium: 8.8 mg/dL — ABNORMAL LOW (ref 8.9–10.3)
Glucose, Bld: 95 mg/dL (ref 65–99)
Potassium: 4.2 mmol/L (ref 3.5–5.1)
SODIUM: 141 mmol/L (ref 135–145)
Total Bilirubin: 0.5 mg/dL (ref 0.3–1.2)
Total Protein: 7.3 g/dL (ref 6.5–8.1)

## 2016-07-18 LAB — ACETAMINOPHEN LEVEL

## 2016-07-18 LAB — SALICYLATE LEVEL

## 2016-07-18 LAB — ETHANOL: ALCOHOL ETHYL (B): 180 mg/dL — AB (ref <5)

## 2016-07-18 NOTE — [facility] Counselor (Signed)
Clinician faxed no-harm contract and substance abuse resources [place] to [place] to [place] for discharge.  Gwinda Passereylese D Bennett, MS, [facility], [facility] Triage Specialist [phone]

## 2016-07-18 NOTE — ED Notes (Signed)
Pt signed no harm contract. Pt given a copy and copy placed in his chart.

## 2016-07-18 NOTE — ED Provider Notes (Signed)
[facility]-EMERGENCY DEPT Provider Note   CSN: [id number] Arrival date & time: 07/18/16  0226     History   Chief Complaint Chief Complaint  Patient presents with  . Medical Clearance    HPI Lucas [name] is a 46 y.o. male.  Patient vague with reasons for presenting to the ED. States he is homeless and has been using drugs. States he's been using alcohol, cocaine and Klonopin. He states he drinks alcohol every day and had 2 beers today but feels that he is getting shaky. He states he normally drinks about 2 beers a day. He says is been walking around in the cold for several days. He is very tearful and withdrawn. He states he is not suicidal or homicidal. He is not hearing any voices. However he states that "doesn't want to go on anymore". And "can't live like this". Denies any chronic medical conditions or prescription medications. Denies any IV drug abuse.   The history is provided by the patient.    Past Medical History:  Diagnosis Date  . Distal radius fracture, right 11/01/2015   comminuted  . Hyperlipemia     There are no active problems to display for this patient.   Past Surgical History:  Procedure Laterality Date  . CARPAL TUNNEL RELEASE Right 11/04/2015   Procedure: CARPAL TUNNEL RELEASE;  Surgeon: Dairl Ponder, MD;  Location: [facility];  Service: Orthopedics;  Laterality: Right;  . OPEN REDUCTION INTERNAL FIXATION (ORIF) DISTAL RADIAL FRACTURE Right 11/04/2015   Procedure: OPEN REDUCTION INTERNAL FIXATION (ORIF) DISTAL RADIAL FRACTURE ;  Surgeon: Dairl Ponder, MD;  Location: [facility];  Service: Orthopedics;  Laterality: Right;  . ORIF CLAVICLE FRACTURE Right 02/06/2014  . SHOULDER SURGERY Right        Home Medications    Prior to Admission medications   Medication Sig Start Date End Date Taking? Authorizing Provider  chlordiazePOXIDE (LIBRIUM) 25 MG capsule 50mg  PO TID x 1D, then 25-50mg  PO BID X 1D, then 25-50mg  PO QD X 1D  07/09/16   Lavera Guise, MD  cloNIDine (CATAPRES) 0.1 MG tablet Take 1 tablet (0.1 mg total) by mouth every 6 (six) hours as needed (withdrawl symtoms). 01/28/16   Azalia Bilis, MD  ondansetron (ZOFRAN ODT) 8 MG disintegrating tablet Take 1 tablet (8 mg total) by mouth every 8 (eight) hours as needed for nausea or vomiting. 01/28/16   Azalia Bilis, MD    Family History No family history on file.  Social History Social History  Substance Use Topics  . Smoking status: Current Every Day Smoker    Packs/day: 0.75    Types: Cigarettes  . Smokeless tobacco: Never Used  . Alcohol use 2.4 oz/week    4 Cans of beer per week     Allergies   Patient has no known allergies.   Review of Systems Review of Systems  Constitutional: Negative for activity change, appetite change and fever.  HENT: Negative for congestion and rhinorrhea.   Respiratory: Negative for cough, chest tightness and shortness of breath.   Cardiovascular: Negative for chest pain.  Gastrointestinal: Negative for abdominal pain, nausea and vomiting.  Genitourinary: Negative for dysuria and testicular pain.  Musculoskeletal: Negative for arthralgias and myalgias.  Skin: Negative for rash.  Psychiatric/Behavioral: Positive for decreased concentration, dysphoric mood and suicidal ideas. The patient is nervous/anxious.   A complete 10 system review of systems was obtained and all systems are negative except as noted in the HPI and PMH.  Physical Exam Updated Vital Signs BP 128/92 (BP Location: Left Arm)   Pulse 86   Temp 97.8 F (36.6 C) (Oral)   Resp 20   Ht 5\' 6"  (1.676 m)   Wt 130 lb (59 kg)   SpO2 96%   BMI 20.98 kg/m   Physical Exam  Constitutional: He is oriented to person, place, and time. He appears well-developed and well-nourished. No distress.  Disheveled Tearful, withdrawn  HENT:  Head: Normocephalic and atraumatic.  Mouth/Throat: Oropharynx is clear and moist. No oropharyngeal exudate.  Eyes:  Conjunctivae and EOM are normal. Pupils are equal, round, and reactive to light.  Neck: Normal range of motion. Neck supple.  No meningismus.  Cardiovascular: Normal rate, regular rhythm, normal heart sounds and intact distal pulses.   No murmur heard. Pulmonary/Chest: Effort normal and breath sounds normal. No respiratory distress.  Abdominal: Soft. There is no tenderness. There is no rebound and no guarding.  Musculoskeletal: Normal range of motion. He exhibits no edema or tenderness.  Neurological: He is alert and oriented to person, place, and time. No cranial nerve deficit. He exhibits normal muscle tone. Coordination normal.  No ataxia on finger to nose bilaterally. No pronator drift. 5/5 strength throughout. CN 2-12 intact.Equal grip strength. Sensation intact.   Skin: Skin is warm.  Psychiatric: He has a normal mood and affect. His behavior is normal.  Nursing note and vitals reviewed.    ED Treatments / Results  Labs (all labs ordered are listed, but only abnormal results are displayed) Labs Reviewed  COMPREHENSIVE METABOLIC PANEL - Abnormal; Notable for the following:       Result Value   Calcium 8.8 (*)    ALT 15 (*)    All other components within normal limits  ACETAMINOPHEN LEVEL - Abnormal; Notable for the following:    Acetaminophen (Tylenol), Serum <10 (*)    All other components within normal limits  ETHANOL - Abnormal; Notable for the following:    Alcohol, Ethyl (B) 180 (*)    All other components within normal limits  CBC WITH DIFFERENTIAL/PLATELET  SALICYLATE LEVEL  RAPID URINE DRUG SCREEN, HOSP PERFORMED  URINALYSIS, ROUTINE W REFLEX MICROSCOPIC    EKG  EKG Interpretation None       Radiology No results found.  Procedures Procedures (including critical care time)  Medications Ordered in ED Medications - No data to display   Initial Impression / Assessment and Plan / ED Course  I have reviewed the triage vital signs and the nursing  notes.  Pertinent labs & imaging results that were available during my care of the patient were reviewed by me and considered in my medical decision making (see chart for details).  Clinical Course    Homeless male with history of alcohol and drug abuse presenting with depression and tearfulness.  Vague thoughts of "not wanting to go on like this".   Labs show alcohol intoxication. Did not fill recently prescribed librium taper.   TTS consult complete. Does not meet inpatient criteria. Will sign no harm contract, outpatient resources given.  Patient able to contract for safety. Denies any suicidal or homicidal thoughts.   Final Clinical Impressions(s) / ED Diagnoses   Final diagnoses:  Polysubstance abuse  Alcoholic intoxication without complication [facility](H[facility])    New Prescriptions New Prescriptions   No medications on file     Glynn OctaveStephen Sabriah Hobbins, MD 07/18/16 [phone]

## 2016-07-18 NOTE — ED Notes (Addendum)
[facility] to be discharged with substance abuse resources and to sign a no harm contract. French Anaracy is to fax this information over.

## 2016-07-18 NOTE — BH Assessment (Addendum)
Tele Assessment Note   Lucas [name] is an 46 y.o. male, who presented voluntarily and unaccompanied to [facility]. Pt reported, "I'm tired of living like this, on drugs." Pt reported, he wanted to get off drugs and he wanted to go to a drug rehabilitation facility right away. Pt denied, SI, HI, AVH and self-injurious behaviors. Pt reported, experiencing the following depressive/anxiety symptoms: sadness, nervousness, feeling hopeless/worthless, decrease sleep and decreased appetite.   Pt denied verbal, physical and sexual abuse. Pt reported, using marijuana, Klonopin, cocaine, and alcohol (per chart, pt's BAL: 180 as of 0329). Pt reported, "I use anything I can get my hands on." Pt reported, he is not linked to OPT resources (such as medication management and/or counseling). Pt reported, [facility] in 2017 or 4-5 days.  Pt denied previous inpatient admissions.   Pt presented sleeping in scrubs with slurred, soft, slow speech. Pt's eye contact was poor (pt had his eye closed during the assessment.) Pt 's mood was anxious. Pt's affect was appropriate to circumstance. Pt's thought process was coherent/relvant. Pt's judgement was impaired. Pt's concentration, insight, and impulse control are poor. Pt reported, if discharged from [facility] he could contract for safety however he was afraid he will use too many drugs and have an accidental overdose. Clinician asked the pt if given resources if he would follow up to receive help however pt reported, he needed something "right away." Pt reported, if inpatient treatment was recommended he will sign in voluntarily.   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 Distal radius fracture, right 11/01/2015   comminuted  . Hyperlipemia     Past Surgical History:  Procedure  Laterality Date  . CARPAL TUNNEL RELEASE Right 11/04/2015   Procedure: CARPAL TUNNEL RELEASE;  Surgeon: Dairl PonderMatthew Weingold, MD;  Location: [facility];  Service: Orthopedics;  Laterality: Right;  . OPEN REDUCTION INTERNAL FIXATION (ORIF) DISTAL RADIAL FRACTURE Right 11/04/2015   Procedure: OPEN REDUCTION INTERNAL FIXATION (ORIF) DISTAL RADIAL FRACTURE ;  Surgeon: Dairl PonderMatthew Weingold, MD;  Location: [facility];  Service: Orthopedics;  Laterality: Right;  . ORIF CLAVICLE FRACTURE Right 02/06/2014  . SHOULDER SURGERY Right     Family History: No family history on file.  Social History:  reports that he has been smoking Cigarettes.  He has been smoking about 0.75 packs per day. He has never used smokeless tobacco. He reports that he drinks about 2.4 oz of alcohol per week . He reports that he uses drugs, including Cocaine, Marijuana, and Methamphetamines.  Additional Social History:  Alcohol / Drug Use Pain Medications: See MAR Prescriptions: See MAR  Over the Counter: See MAR History of alcohol / drug use?: Yes Substance #1 Name of Substance 1: Marijuana 1 - Age of First Use: UTA 1 - Amount (size/oz): UTA 1 - Frequency: UTA 1 - Duration: UTA 1 - Last Use / Amount: Yesterday Substance #2 Name of Substance 2: Klonopin 2 - Age of First Use: UTA 2 - Amount (size/oz): UTA 2 - Frequency: UTA 2 - Duration: UTA 2 - Last Use / Amount: Yesterday Substance #3 Name of Substance 3: Alcohol 3 - Age  of First Use: UTA 3 - Amount (size/oz): UTA 3 - Frequency: UTA 3 - Duration: UTA 3 - Last Use / Amount: Yesterday Substance #4 Name of Substance 4: Cocaine 4 - Age of First Use: UTA 4 - Amount (size/oz): UTA 4 - Frequency: UTA 4 - Duration: UTA 4 - Last Use / Amount: Yesterday  CIWA: CIWA-Ar BP: 128/92 Pulse Rate: 86 COWS:    PATIENT STRENGTHS: (choose at least two) Average or above average intelligence Motivation for treatment/growth  Allergies: No Known  Allergies  Home Medications:  (Not in a hospital admission)  OB/GYN Status:  No LMP for male patient.  General Assessment Data Location of Assessment: [facility] TTS Assessment: In system Is this a Tele or Face-to-Face Assessment?: Tele Assessment Is this an Initial Assessment or a Re-assessment for this encounter?: Initial Assessment Marital status: Single Maiden name: NA Is patient pregnant?: No Pregnancy Status: No Living Arrangements: Alone Can pt return to current living arrangement?: Yes Admission Status: Voluntary Is patient capable of signing voluntary admission?: Yes Referral Source: Self/Family/Friend Insurance type: Self-pay     Crisis Care Plan Living Arrangements: Alone Legal Guardian: Other: (Self) Name of Psychiatrist: NA Name of Therapist: NA  Education Status Is patient currently in school?: No Current Grade: NA Highest grade of school patient has completed: Pt reports, three years of college.  Name of school: NA Contact person: NA  Risk to self with the past 6 months Suicidal Ideation: No (Pt denies. ) Has patient been a risk to self within the past 6 months prior to admission? : No Suicidal Intent: No Has patient had any suicidal intent within the past 6 months prior to admission? : No Is patient at risk for suicide?: No Suicidal Plan?: No Has patient had any suicidal plan within the past 6 months prior to admission? : No Access to Means: No What has been your use of drugs/alcohol within the last 12 months?: Marijuana, alcohol, klopin and cocaine.  Previous Attempts/Gestures: No How many times?: 0 Other Self Harm Risks: Pt denies.  Triggers for Past Attempts: None known Intentional Self Injurious Behavior: None (Pt denies. ) Family Suicide History: Unable to assess Recent stressful life event(s): Other (Comment) (Pt reported, wanting to get off drugs.) Persecutory voices/beliefs?: No Depression: Yes Depression Symptoms: Feeling worthless/self  pity (sad) Substance abuse history and/or treatment for substance abuse?: Yes Suicide prevention information given to non-admitted patients: Not applicable  Risk to Others within the past 6 months Homicidal Ideation: No (Pt denies. ) Does patient have any lifetime risk of violence toward others beyond the six months prior to admission? : No Thoughts of Harm to Others: No Current Homicidal Intent: No Current Homicidal Plan: No Access to Homicidal Means: No Identified Victim: NA History of harm to others?: No Assessment of Violence: None Noted Violent Behavior Description: NA Does patient have access to weapons?: Yes (Comment) Criminal Charges Pending?: No Does patient have a court date: No Is patient on probation?: No  Psychosis Hallucinations: None noted Delusions: None noted  Mental Status Report Appearance/Hygiene: In scrubs Eye Contact: Poor Motor Activity: Unremarkable Speech: Slurred, Soft, Slow Level of Consciousness: Sleeping Mood: Anxious Affect: Appropriate to circumstance Anxiety Level: Minimal Thought Processes: Coherent, Relevant Judgement: Impaired Orientation: Other (Comment) (date, year, city and state.) Obsessive Compulsive Thoughts/Behaviors: None  Cognitive Functioning Concentration: Poor Memory: Recent Intact IQ: Average Insight: Poor Impulse Control: Poor Appetite: Poor Weight Loss: 0 Weight Gain: 0 Sleep: No Change Total Hours of Sleep:  (Pt reported he  has not slept in 48 hour.s. ) Vegetative Symptoms: None  ADLScreening [facility] Assessment Services) Patient's cognitive ability adequate to safely complete daily activities?: Yes Patient able to express need for assistance with ADLs?: Yes Independently performs ADLs?: Yes (appropriate for developmental age)  Prior Inpatient Therapy Prior Inpatient Therapy: No Prior Therapy Dates: N Prior Therapy Facilty/Provider(s): NA Reason for Treatment: NA  Prior Outpatient Therapy Prior Outpatient  Therapy: No Prior Therapy Dates: NA Prior Therapy Facilty/Provider(s): NA Reason for Treatment: NA Does patient have an ACCT team?: No Does patient have Intensive In-House Services?  : No Does patient have [facility] services? : No Does patient have P4CC services?: No  ADL Screening (condition at time of admission) Patient's cognitive ability adequate to safely complete daily activities?: Yes Is the patient deaf or have difficulty hearing?: No Does the patient have difficulty seeing, even when wearing glasses/contacts?: No Does the patient have difficulty concentrating, remembering, or making decisions?: No Patient able to express need for assistance with ADLs?: Yes Does the patient have difficulty dressing or bathing?: No Independently performs ADLs?: Yes (appropriate for developmental age) Does the patient have difficulty walking or climbing stairs?: No Weakness of Legs: None Weakness of Arms/Hands: None       Abuse/Neglect Assessment (Assessment to be complete while patient is alone) Physical Abuse: Denies (Pt denies. ) Verbal Abuse: Denies (Pt denies. ) Sexual Abuse: Denies (Pt denies. )     Advance Directives (For Healthcare) Does Patient Have a Medical Advance Directive?: No    Additional Information 1:1 In Past 12 Months?: No CIRT Risk: No Elopement Risk: No Does patient have medical clearance?: No     Disposition: Nira Conn, NP recommended discharged with resources.  Disposition discussed withDr. Manus Gunning and Lawson Fiscal, RN.  Disposition Initial Assessment Completed for this Encounter: Yes Disposition of Patient: Other dispositions (Discharged with OPT resources. ) Other disposition(s):  (Dischargfe with OPT resoruces. )  Gwinda Passe 07/18/2016 6:00 AM   Gwinda Passe, MS, [facility], [facility] Triage Specialist [phone]

## 2016-07-18 NOTE — ED Triage Notes (Signed)
Pt has multiple complaints but is vague about what kind of help he needs.   Pt states he is homeless and is cold.  Pt states he thought his feet and fingers were frostibit because he has been walking around in the cold for several days.

## 2016-07-18 NOTE — ED Notes (Signed)
Attempted to call BH back at [phone]

## 2016-08-12 ENCOUNTER — Encounter (HOSPITAL_COMMUNITY): Payer: Self-pay | Admitting: *Deleted

## 2016-08-12 ENCOUNTER — Emergency Department (HOSPITAL_COMMUNITY)
Admission: EM | Admit: 2016-08-12 | Discharge: 2016-08-12 | Disposition: A | Discharge status: Home / Self Care | Payer: Self-pay | Attending: Emergency Medicine | Admitting: Emergency Medicine

## 2016-08-12 DIAGNOSIS — F1410 Cocaine abuse, uncomplicated: Secondary | ICD-10-CM | POA: Insufficient documentation

## 2016-08-12 DIAGNOSIS — Z5181 Encounter for therapeutic drug level monitoring: Secondary | ICD-10-CM | POA: Insufficient documentation

## 2016-08-12 DIAGNOSIS — F1910 Other psychoactive substance abuse, uncomplicated: Secondary | ICD-10-CM | POA: Insufficient documentation

## 2016-08-12 DIAGNOSIS — F1010 Alcohol abuse, uncomplicated: Secondary | ICD-10-CM | POA: Insufficient documentation

## 2016-08-12 DIAGNOSIS — F17210 Nicotine dependence, cigarettes, uncomplicated: Secondary | ICD-10-CM | POA: Insufficient documentation

## 2016-08-12 LAB — BASIC METABOLIC PANEL
Anion gap: 9 (ref 5–15)
BUN: 9 mg/dL (ref 6–20)
CHLORIDE: 105 mmol/L (ref 101–111)
CO2: 26 mmol/L (ref 22–32)
Calcium: 8.9 mg/dL (ref 8.9–10.3)
Creatinine, Ser: 0.83 mg/dL (ref 0.61–1.24)
GFR calc Af Amer: >60 mL/min (ref >60)
GFR calc non Af Amer: >60 mL/min (ref >60)
Glucose, Bld: 87 mg/dL (ref 65–99)
POTASSIUM: 3.9 mmol/L (ref 3.5–5.1)
SODIUM: 140 mmol/L (ref 135–145)

## 2016-08-12 LAB — CBC WITH DIFFERENTIAL/PLATELET
BASOS ABS: 0 10*3/uL (ref 0.0–0.1)
Basophils Relative: 1 %
Eosinophils Absolute: 0.1 10*3/uL (ref 0.0–0.7)
Eosinophils Relative: 1 %
HEMATOCRIT: 40.8 % (ref 39.0–52.0)
Hemoglobin: 14.2 g/dL (ref 13.0–17.0)
LYMPHS ABS: 2.4 10*3/uL (ref 0.7–4.0)
Lymphocytes Relative: 37 %
MCH: 32.3 pg (ref 26.0–34.0)
MCHC: 34.8 g/dL (ref 30.0–36.0)
MCV: 92.9 fL (ref 78.0–100.0)
MONO ABS: 0.5 10*3/uL (ref 0.1–1.0)
Monocytes Relative: 8 %
Neutro Abs: 3.5 10*3/uL (ref 1.7–7.7)
Neutrophils Relative %: 53 %
Platelets: 185 10*3/uL (ref 150–400)
RBC: 4.39 MIL/uL (ref 4.22–5.81)
RDW: 12.6 % (ref 11.5–15.5)
WBC: 6.6 10*3/uL (ref 4.0–10.5)

## 2016-08-12 LAB — RAPID URINE DRUG SCREEN, HOSP PERFORMED
AMPHETAMINES: NOT DETECTED
BENZODIAZEPINES: NOT DETECTED
Barbiturates: NOT DETECTED
Cocaine: POSITIVE — AB
OPIATES: NOT DETECTED
Tetrahydrocannabinol: POSITIVE — AB

## 2016-08-12 LAB — ETHANOL: ALCOHOL ETHYL (B): 118 mg/dL — AB (ref <5)

## 2016-08-12 NOTE — ED Provider Notes (Signed)
[facility] Provider Note   CSN: [id number] Arrival date & time: 08/12/16  0221  Time seen 03:30 AM   History   Chief Complaint Chief Complaint  Patient presents with  . V70.1    HPI Bing MatterDouglas Schlotter is a 46 y.o. male.  HPI  patient states he's here because he is "dehydrated". When asked why he is dehydrated but states because he hasn't had anything to eat or drink for a few days. 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 He denies any substance abuse although he's been seen several times in last few months for that. Denies any alcohol or cocaine use. He states he's depressed but he denies suicidal or homicidal ideation. He denies nausea, vomiting, diarrhea, cough, sore throat, or rhinorrhea.  Past Medical History:  Diagnosis Date  . Distal radius fracture, right 11/01/2015   comminuted  . Hyperlipemia     There are no active problems to display for this patient.   Past Surgical History:  Procedure Laterality Date  . CARPAL TUNNEL RELEASE Right 11/04/2015   Procedure: CARPAL TUNNEL RELEASE;  Surgeon: Dairl PonderMatthew Weingold, MD;  Location: [facility];  Service: Orthopedics;  Laterality: Right;  . OPEN REDUCTION INTERNAL FIXATION (ORIF) DISTAL RADIAL FRACTURE Right 11/04/2015   Procedure: OPEN REDUCTION INTERNAL FIXATION (ORIF) DISTAL RADIAL FRACTURE ;  Surgeon: Dairl PonderMatthew Weingold, MD;  Location: [facility];  Service: Orthopedics;  Laterality: Right;  . ORIF CLAVICLE FRACTURE Right 02/06/2014  . SHOULDER SURGERY Right        Home Medications    Prior to Admission medications   Medication Sig Start Date End Date Taking? Authorizing Provider  chlordiazePOXIDE (LIBRIUM) 25 MG capsule 50mg  PO TID x 1D, then 25-50mg  PO BID X 1D, then 25-50mg  PO QD X 1D 07/09/16   Lavera Guiseana Duo Liu, MD  cloNIDine (CATAPRES) 0.1 MG tablet Take 1 tablet (0.1 mg total) by mouth every 6 (six) hours as needed (withdrawl  symtoms). 01/28/16   Azalia BilisKevin Campos, MD  ondansetron (ZOFRAN ODT) 8 MG disintegrating tablet Take 1 tablet (8 mg total) by mouth every 8 (eight) hours as needed for nausea or vomiting. 01/28/16   Azalia BilisKevin Campos, MD    Family History No family history on file.  Social History Social History  Substance Use Topics  . Smoking status: Current Every Day Smoker    Packs/day: 0.75    Types: Cigarettes  . Smokeless tobacco: Never Used  . Alcohol use 2.4 oz/week    4 Cans of beer per week  unemployed roofer Denies ETOH or cocaine use now   Allergies   Patient has no known allergies.   Review of Systems Review of Systems  All other systems reviewed and are negative.    Physical Exam Updated Vital Signs BP 115/79   Pulse 82   Temp 98.4 F (36.9 C)   Resp 20   Ht 5\' 6"  (1.676 m)   Wt 130 lb (59 kg)   SpO2 96%   BMI 20.98 kg/m   Vital signs normal    Physical Exam  Constitutional: He is oriented to person, place, and time. He appears well-developed and well-nourished.  Non-toxic appearance. He does not appear ill. No distress.  HENT:  Head: Normocephalic and atraumatic.  Right Ear: External ear normal.  Left Ear: External ear normal.  Nose: Nose normal. No mucosal edema or rhinorrhea.  Mouth/Throat: Oropharynx is clear and moist and mucous membranes  are normal. No dental abscesses or uvula swelling.  Eyes: Conjunctivae and EOM are normal. Pupils are equal, round, and reactive to light.  Neck: Normal range of motion and full passive range of motion without pain. Neck supple.  Cardiovascular: Normal rate, regular rhythm and normal heart sounds.  Exam reveals no gallop and no friction rub.   No murmur heard. Pulmonary/Chest: Effort normal and breath sounds normal. No respiratory distress. He has no wheezes. He has no rhonchi. He has no rales. He exhibits no tenderness and no crepitus.  Abdominal: Soft. Normal appearance and bowel sounds are normal. He exhibits no distension. There  is no tenderness. There is no rebound and no guarding.  Musculoskeletal: Normal range of motion. He exhibits no edema or tenderness.  Moves all extremities well.   Neurological: He is alert and oriented to person, place, and time. He has normal strength. No cranial nerve deficit.  Skin: Skin is warm, dry and intact. No rash noted. No erythema. No pallor.  Psychiatric: His speech is delayed. He is slowed.  Flat affect, speaks softly  Nursing note and vitals reviewed.    ED Treatments / Results  Labs (all labs ordered are listed, but only abnormal results are displayed) Results for orders placed or performed during the hospital encounter of 08/12/16  Urine rapid drug screen (hosp performed)  Result Value Ref Range   Opiates NONE DETECTED NONE DETECTED   Cocaine POSITIVE (A) NONE DETECTED   Benzodiazepines NONE DETECTED NONE DETECTED   Amphetamines NONE DETECTED NONE DETECTED   Tetrahydrocannabinol POSITIVE (A) NONE DETECTED   Barbiturates NONE DETECTED NONE DETECTED  Basic metabolic panel  Result Value Ref Range   Sodium 140 135 - 145 mmol/L   Potassium 3.9 3.5 - 5.1 mmol/L   Chloride 105 101 - 111 mmol/L   CO2 26 22 - 32 mmol/L   Glucose, Bld 87 65 - 99 mg/dL   BUN 9 6 - 20 mg/dL   Creatinine, Ser 1.61 0.61 - 1.24 mg/dL   Calcium 8.9 8.9 - 09.6 mg/dL   GFR calc non Af Amer >60 >60 mL/min   GFR calc Af Amer >60 >60 mL/min   Anion gap 9 5 - 15  Ethanol  Result Value Ref Range   Alcohol, Ethyl (B) 118 (H) <5 mg/dL  CBC with Differential  Result Value Ref Range   WBC 6.6 4.0 - 10.5 K/uL   RBC 4.39 4.22 - 5.81 MIL/uL   Hemoglobin 14.2 13.0 - 17.0 g/dL   HCT 04.5 40.9 - 81.1 %   MCV 92.9 78.0 - 100.0 fL   MCH 32.3 26.0 - 34.0 pg   MCHC 34.8 30.0 - 36.0 g/dL   RDW 91.4 78.2 - 95.6 %   Platelets 185 150 - 400 K/uL   Neutrophils Relative % 53 %   Neutro Abs 3.5 1.7 - 7.7 K/uL   Lymphocytes Relative 37 %   Lymphs Abs 2.4 0.7 - 4.0 K/uL   Monocytes Relative 8 %   Monocytes  Absolute 0.5 0.1 - 1.0 K/uL   Eosinophils Relative 1 %   Eosinophils Absolute 0.1 0.0 - 0.7 K/uL   Basophils Relative 1 %   Basophils Absolute 0.0 0.0 - 0.1 K/uL   Laboratory interpretation all normal except alcohol intoxication although patient denies drinking, + UDS for cocaine although he states he stopped    Procedures Procedures (including critical care time)  Medications Ordered in ED Medications - No data to display   Initial Impression /  Assessment and Plan / ED Course  I have reviewed the triage vital signs and the nursing notes.  Pertinent labs & imaging results that were available during my care of the patient were reviewed by me and considered in my medical decision making (see chart for details).   laboratory testing was done. Patient is lying about stopping drinking and doing drugs. He was discharged from the ED. He was seen twice in December for substance abuse and this is his second visit in January for the same. He has already been evaluated by TTS several times who recommend outpatient treatment.  Final Clinical Impressions(s) / ED Diagnoses   Final diagnoses:  Polysubstance abuse  Cocaine abuse  Alcohol abuse    Plan discharge  Devoria Albe, MD, Concha Pyo, MD 08/12/16 [phone]

## 2016-08-12 NOTE — ED Triage Notes (Signed)
Pt very vague with complaints of why he came to er, states that he has been depressed, not eating in the past 3 days and wants to live, RN asked pt several times about any SI or HI, pt vague with answer but denies any SI or HI,

## 2016-08-12 NOTE — Discharge Instructions (Signed)
Follow the instructions you have already been given several times in the past month to get help with your addiction. You lied tonight when you said you weren't using anymore.

## 2016-08-17 ENCOUNTER — Encounter (HOSPITAL_COMMUNITY): Payer: Self-pay | Admitting: *Deleted

## 2016-08-17 ENCOUNTER — Emergency Department (HOSPITAL_COMMUNITY)
Admission: EM | Admit: 2016-08-17 | Discharge: 2016-08-17 | Disposition: A | Discharge status: Home / Self Care | Payer: Self-pay | Attending: Emergency Medicine | Admitting: Emergency Medicine

## 2016-08-17 ENCOUNTER — Emergency Department (HOSPITAL_COMMUNITY): Payer: Self-pay

## 2016-08-17 DIAGNOSIS — F17210 Nicotine dependence, cigarettes, uncomplicated: Secondary | ICD-10-CM | POA: Insufficient documentation

## 2016-08-17 DIAGNOSIS — R079 Chest pain, unspecified: Secondary | ICD-10-CM | POA: Insufficient documentation

## 2016-08-17 DIAGNOSIS — Z5900 Homelessness unspecified: Secondary | ICD-10-CM

## 2016-08-17 DIAGNOSIS — J029 Acute pharyngitis, unspecified: Secondary | ICD-10-CM | POA: Insufficient documentation

## 2016-08-17 DIAGNOSIS — R197 Diarrhea, unspecified: Secondary | ICD-10-CM | POA: Insufficient documentation

## 2016-08-17 LAB — BASIC METABOLIC PANEL
ANION GAP: 8 (ref 5–15)
BUN: 9 mg/dL (ref 6–20)
CALCIUM: 8.5 mg/dL — AB (ref 8.9–10.3)
CO2: 25 mmol/L (ref 22–32)
CREATININE: 0.72 mg/dL (ref 0.61–1.24)
Chloride: 106 mmol/L (ref 101–111)
Glucose, Bld: 123 mg/dL — ABNORMAL HIGH (ref 65–99)
Potassium: 3.7 mmol/L (ref 3.5–5.1)
SODIUM: 139 mmol/L (ref 135–145)

## 2016-08-17 NOTE — Discharge Instructions (Signed)
Drink plenty of fluids. You can take imodium OTC for diarrhea.

## 2016-08-17 NOTE — ED Provider Notes (Signed)
[facility]-EMERGENCY DEPT Provider Note   CSN: [id number] Arrival date & time: 08/17/16  0256  Time seen 04:10 AM   History   Chief Complaint Chief Complaint  Patient presents with  . Generalized Body Aches    HPI Lucas [name] is a 46 y.o. male.  HPI  patient states February 2 in the evening he started having cough with some brown mucus production, stuffy nose, sore throat, central chest pain when he coughs, with diarrhea. He states he's had about 4 episodes of loose and watery diarrhea today. He denies abdominal pain, nausea, vomiting, fever, feeling dizzy or lightheaded. Please note history is difficult because when asked patient what was wrong he would just say "chest", "virus". He admits to 2 beers tonight  PCP none  Past Medical History:  Diagnosis Date  . Distal radius fracture, right 11/01/2015   comminuted  . Hyperlipemia     There are no active problems to display for this patient.   Past Surgical History:  Procedure Laterality Date  . CARPAL TUNNEL RELEASE Right 11/04/2015   Procedure: CARPAL TUNNEL RELEASE;  Surgeon: Dairl Ponder, MD;  Location: [facility];  Service: Orthopedics;  Laterality: Right;  . OPEN REDUCTION INTERNAL FIXATION (ORIF) DISTAL RADIAL FRACTURE Right 11/04/2015   Procedure: OPEN REDUCTION INTERNAL FIXATION (ORIF) DISTAL RADIAL FRACTURE ;  Surgeon: Dairl Ponder, MD;  Location: [facility];  Service: Orthopedics;  Laterality: Right;  . ORIF CLAVICLE FRACTURE Right 02/06/2014  . SHOULDER SURGERY Right        Home Medications    Prior to Admission medications   Medication Sig Start Date End Date Taking? Authorizing Provider  chlordiazePOXIDE (LIBRIUM) 25 MG capsule 50mg  PO TID x 1D, then 25-50mg  PO BID X 1D, then 25-50mg  PO QD X 1D 07/09/16   Lavera Guise, MD  cloNIDine (CATAPRES) 0.1 MG tablet Take 1 tablet (0.1 mg total) by mouth every 6 (six) hours as needed (withdrawl symtoms). 01/28/16   Azalia Bilis, MD    ondansetron (ZOFRAN ODT) 8 MG disintegrating tablet Take 1 tablet (8 mg total) by mouth every 8 (eight) hours as needed for nausea or vomiting. 01/28/16   Azalia Bilis, MD    Family History History reviewed. No pertinent family history.  Social History Social History  Substance Use Topics  . Smoking status: Current Every Day Smoker    Packs/day: 0.75    Types: Cigarettes  . Smokeless tobacco: Never Used  . Alcohol use 2.4 oz/week    4 Cans of beer per week  employed in roofing Smokes 1 ppd  Allergies   Patient has no known allergies.   Review of Systems Review of Systems  All other systems reviewed and are negative.    Physical Exam Updated Vital Signs BP 113/74 (BP Location: Left Arm)   Pulse 91   Temp 98.6 F (37 C) (Oral)   Resp 18   Ht 5\' 6"  (1.676 m)   Wt 130 lb (59 kg)   SpO2 97%   BMI 20.98 kg/m   Vital signs normal    Physical Exam  Constitutional: He is oriented to person, place, and time. He appears well-developed and well-nourished.  Non-toxic appearance. He does not appear ill. No distress.  HENT:  Head: Normocephalic and atraumatic.  Right Ear: External ear normal.  Left Ear: External ear normal.  Nose: Nose normal. No mucosal edema or rhinorrhea.  Mouth/Throat: Oropharynx is clear and moist and mucous membranes are normal. No dental abscesses or uvula  swelling.  Eyes: Conjunctivae and EOM are normal. Pupils are equal, round, and reactive to light.  Neck: Normal range of motion and full passive range of motion without pain. Neck supple.  Cardiovascular: Normal rate, regular rhythm and normal heart sounds.  Exam reveals no gallop and no friction rub.   No murmur heard. Pulmonary/Chest: Effort normal and breath sounds normal. No respiratory distress. He has no wheezes. He has no rhonchi. He has no rales. He exhibits no tenderness and no crepitus.  Abdominal: Soft. Normal appearance and bowel sounds are normal. He exhibits no distension. There is no  tenderness. There is no rebound and no guarding.  Musculoskeletal: Normal range of motion. He exhibits no edema or tenderness.  Moves all extremities well.   Neurological: He is alert and oriented to person, place, and time. He has normal strength. No cranial nerve deficit.  Skin: Skin is warm, dry and intact. No rash noted. No erythema. No pallor.  Psychiatric: He has a normal mood and affect. His speech is normal and behavior is normal. His mood appears not anxious.  Nursing note and vitals reviewed.    ED Treatments / Results  Labs (all labs ordered are listed, but only abnormal results are displayed) Results for orders placed or performed during the hospital encounter of 08/17/16  Basic metabolic panel  Result Value Ref Range   Sodium 139 135 - 145 mmol/L   Potassium 3.7 3.5 - 5.1 mmol/L   Chloride 106 101 - 111 mmol/L   CO2 25 22 - 32 mmol/L   Glucose, Bld 123 (H) 65 - 99 mg/dL   BUN 9 6 - 20 mg/dL   Creatinine, Ser 1.61 0.61 - 1.24 mg/dL   Calcium 8.5 (L) 8.9 - 10.3 mg/dL   GFR calc non Af Amer >60 >60 mL/min   GFR calc Af Amer >60 >60 mL/min   Anion gap 8 5 - 15   Laboratory interpretation all normal     EKG  EKG Interpretation None       Radiology Dg Chest 2 View  Result Date: 08/17/2016 CLINICAL DATA:  Flu-like symptoms for 3 days. EXAM: CHEST  2 VIEW COMPARISON:  11/01/2015 FINDINGS: Emphysematous changes in the lungs. Scattered fibrosis. Peribronchial thickening suggesting chronic bronchitis. Normal heart size and pulmonary vascularity. No focal airspace disease or consolidation in the lungs. No blunting of costophrenic angles. No pneumothorax. Mediastinal contours appear intact. Postoperative plate and screw fixation of the right clavicle. IMPRESSION: Emphysematous and chronic bronchitic changes in the lungs. No evidence of active pulmonary disease. Electronically Signed   By: Burman Nieves M.D.   On: 08/17/2016 04:58    Procedures Procedures (including  critical care time)  Medications Ordered in ED Medications - No data to display   Initial Impression / Assessment and Plan / ED Course  I have reviewed the triage vital signs and the nursing notes.  Pertinent labs & imaging results that were available during my care of the patient were reviewed by me and considered in my medical decision making (see chart for details).  BMET done to look for electrolyte imbalance from diarrhea.     Pt had no episodes of diarrhea while in the ED. He was given oral fluids for hydration.  He had no coughing and his room was close to my office. Nurses report patient had just been sitting out in the waiting room earlier this evening because he was homeless and was asked to leave by security about 1 hour before he  came back and signed in to be seen in the ED.   Final Clinical Impressions(s) / ED Diagnoses   Final diagnoses:  Homeless    Plan discharge  Devoria AlbeIva Cambell Stanek, MD, Concha PyoFACEP    Lisa Blakeman, MD 08/17/16 0600

## 2016-08-17 NOTE — ED Notes (Signed)
Pt given a Sprite to drink

## 2016-08-17 NOTE — ED Triage Notes (Signed)
Pt c/o flu-like symptoms x3 days

## 2016-09-10 ENCOUNTER — Encounter (HOSPITAL_COMMUNITY): Payer: Self-pay

## 2016-09-10 ENCOUNTER — Emergency Department (HOSPITAL_COMMUNITY)
Admission: EM | Admit: 2016-09-10 | Discharge: 2016-09-10 | Disposition: A | Discharge status: Home / Self Care | Payer: Self-pay | Attending: Dermatology | Admitting: Dermatology

## 2016-09-10 DIAGNOSIS — Y9301 Activity, walking, marching and hiking: Secondary | ICD-10-CM | POA: Insufficient documentation

## 2016-09-10 DIAGNOSIS — Y92410 Unspecified street and highway as the place of occurrence of the external cause: Secondary | ICD-10-CM | POA: Insufficient documentation

## 2016-09-10 DIAGNOSIS — Z5321 Procedure and treatment not carried out due to patient leaving prior to being seen by health care provider: Secondary | ICD-10-CM | POA: Insufficient documentation

## 2016-09-10 DIAGNOSIS — Y999 Unspecified external cause status: Secondary | ICD-10-CM | POA: Insufficient documentation

## 2016-09-10 DIAGNOSIS — S4991XA Unspecified injury of right shoulder and upper arm, initial encounter: Secondary | ICD-10-CM | POA: Insufficient documentation
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
## 2016-09-10 NOTE — ED Notes (Signed)
Patient ambulatory out of room, stating that he is going to leave.  Did not sign out AMA.  [place] [facility] [place] [facility]  Patient did not have any acute distress as he was leaving.

## 2016-09-21 ENCOUNTER — Emergency Department (HOSPITAL_COMMUNITY)
Admission: EM | Admit: 2016-09-21 | Discharge: 2016-09-21 | Disposition: A | Discharge status: Home / Self Care | Payer: Self-pay | Attending: Emergency Medicine | Admitting: Emergency Medicine

## 2016-09-21 ENCOUNTER — Encounter (HOSPITAL_COMMUNITY): Payer: Self-pay | Admitting: *Deleted

## 2016-09-21 DIAGNOSIS — F17210 Nicotine dependence, cigarettes, uncomplicated: Secondary | ICD-10-CM | POA: Insufficient documentation

## 2016-09-21 DIAGNOSIS — F1410 Cocaine abuse, uncomplicated: Secondary | ICD-10-CM | POA: Insufficient documentation

## 2016-09-21 LAB — I-STAT CHEM 8, ED
BUN: 7 mg/dL (ref 6–20)
CALCIUM ION: 1.05 mmol/L — AB (ref 1.15–1.40)
CHLORIDE: 103 mmol/L (ref 101–111)
Creatinine, Ser: 0.9 mg/dL (ref 0.61–1.24)
Glucose, Bld: 119 mg/dL — ABNORMAL HIGH (ref 65–99)
HEMATOCRIT: 41 % (ref 39.0–52.0)
Hemoglobin: 13.9 g/dL (ref 13.0–17.0)
Potassium: 3.8 mmol/L (ref 3.5–5.1)
SODIUM: 142 mmol/L (ref 135–145)
TCO2: 28 mmol/L (ref 0–100)

## 2016-09-21 MED ORDER — ACETAMINOPHEN 325 MG PO TABS
650.0000 mg | ORAL_TABLET | Freq: Once | ORAL | Status: AC
  Administered 2016-09-21: 650 mg via ORAL
  Filled 2016-09-21: qty 2
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
## 2016-09-21 NOTE — ED Triage Notes (Addendum)
Pt c/o being homeless, seeking detox from alcohol and drugs, and feeling dehydrated, pt denies any SI or HI

## 2016-09-21 NOTE — Discharge Instructions (Signed)
Substance Abuse Treatment Programs ° °Intensive Outpatient Programs °[facility]     °[address]      °[place], [place]                   °[phone]      ° °[facility] °[address] °[place], [place] °[phone] ° °[facility]     °(Inpatient and outpatient)     °[address]           °[phone]   ° °[facility] °[phone] (Suboxone and Methadone) ° °[address]      °[place], [place] [address]      °[phone]      ° °[address] °[place], [place] °[phone] ° °[facility] (Outpatient/Inpatient, Chemical)    °(insurance only) [phone]      °       °[facility] (Groups & Residential) °[place], [place] °[phone] ° °   °[facility]     °[address]     °[place], [place]      °[phone]      ° °[facility] (for caregivers and family) °[address] °[place], [place] °336-299-4655 ° ° ° ° ° °Residential Treatment Programs °[facility]      °[address], [place], [place] [address]  °[phone]      ° °[facility] °[address] [place], [place] [address] °[phone] ° °[facility]        °[phone]      ° °[facility] °[phone] ° °[facility]             °[address]                                         °[place], [place]                                                °[phone] or [phone]                              ° °[facility] [place] °[address] °[place], [address] °[phone] ° °[facility]    °[address]      °[place], [place]     °[phone]      ° °[facility] °[address] °[place], [place] °[phone] ° °[facility]   °[address]     °[place], [place] [address]     °[phone]      °Admissions: 8am-3pm M-F ° °[facility] °[address] °[place],  [place] °336-227-7417 ° °[facility]: Residential Program (90 Days)   °[place], [place]      °336-725-8389 or 800-758-6077    ° °[facility] °[place], [place] °(Walk in Hours over the weekend or by referral) ° °[facility] °[address], [place],  [address] °([phone] ° °C[facility]is Mobile: [facility]:  1-877-626-1772 (for crisis response 24 hours a day) °[facility] Hotline:      1-800-256-2452 °Outpatient Psychiatry and Counseling ° °[facility]: Mobile Crisis   Management 24 hours:  1-877-626-1772 ° °[facility] sliding scale fee and walk in schedule: M-F 8am-12pm/1pm-3pm °[address]  °[place], [place] [address] °[phone] ° °[facility] °[address] °[place] [address] °[phone] ° °[facility] ([facility] known [facility])- new patient walk-in appointments available Monday - Friday 8am -3pm.          °[address] °[place], [place] [address] °336-676-6840 or crisis line- 336-676-6905 ° °[facility] Intensive Outpatient Therapy Program °[address] °[place], [place] [address] °336-832-9804 ° °[facility]                  °Crisis Services      °[phone]      °[address]     °[place], [place] [address]                ° °[facility]   °[facility] °[phone] °[address] °[place], [place] [address] ° ° °[facility]          °[address]  °[place], [place] [address]       °([phone] ° °[facility] °[address] °[place], [place] [address] °336-292-1510 ° °[facility]    °[address]    °[place], [place] [address]     °[phone]      ° °Parish McKinney, MD     °[address]     °[place] [address]     °[phone]     °  °[facility] °[address] °[place] [address] ° °[facility]     °[address]     °[place], [place]      °336-542-2076      ° °[facility] °Shamsher Ahluwalia, MD °[address] °[place], [place] [address] °[phone] ° °[facility]     °[address]     °[place], [place] [address]     °336-274-1237      ° °[facility] °[address] °[place], [place] [address] °336-854-4450 °Resources for Temporary Residential Assistance/Crisis Centers ° °DAY CENTERS °[facility] °M-F 8am-3pm   °[address] [place], [place] [address]   336-332-0824 °Services include: laundry, barbering, support groups, case management, phone  & computer access, showers, AA/NA mtgs, mental health/substance abuse nurse, job skills class, disability information, VA assistance, spiritual classes, etc.  ° °HOMELESS SHELTERS ° °[facility]     °[facility]   °[address], [place]     °[phone]       °       °[facility] (women and children)       °[address] °[place], [place] [address] °336-275-0820 °[email or web address] for application and process °Application Required ° °[facility]   °[address]    °[place] [address]     °[phone]       °             °[facility] °[address] °[place], [place] [address] °336.273.5572 °336-235-0363(schedule application appt.) °Application Required ° °[facility] (women only)    °[address]     °[place], [place] [address]     °336-884-1039      °  Intake starts 6pm daily °Need valid ID, SSC, & Police report °[facility] [place] °[address] °[place], [place] °336-881-5420 °Application Required ° °[facility] (men only)     °[address]      °[place], [place]     °[phone]      ° °[facility] °(Pregnant women only) °[address] °[place], [place] °336-275-0206 ° °[facility]      °[address]      °[place], [place] [address]     °[phone]      °       °[facility] °[address] °[place], [place] °336-723-1848 °90 day commitment/SA/Application process ° °[facility](men only)     °[address]     °[place], [place]     °[phone]       °Check-in at 7pm     °       °[facility] °[address] °[place], [place] [address] °336-248-6684 °Men/Women/Women and Children must be there by 7 pm ° °[facility] °[place], [place] °336-722-8721                ° °

## 2016-09-22 ENCOUNTER — Emergency Department (HOSPITAL_COMMUNITY)
Admission: EM | Admit: 2016-09-22 | Discharge: 2016-09-22 | Disposition: A | Discharge status: Home / Self Care | Payer: Self-pay | Attending: Emergency Medicine | Admitting: Emergency Medicine

## 2016-09-22 ENCOUNTER — Encounter (HOSPITAL_COMMUNITY): Payer: Self-pay | Admitting: *Deleted

## 2016-09-22 DIAGNOSIS — F17210 Nicotine dependence, cigarettes, uncomplicated: Secondary | ICD-10-CM | POA: Insufficient documentation

## 2016-09-22 DIAGNOSIS — Y929 Unspecified place or not applicable: Secondary | ICD-10-CM | POA: Insufficient documentation

## 2016-09-22 DIAGNOSIS — M791 Myalgia, unspecified site: Secondary | ICD-10-CM

## 2016-09-22 DIAGNOSIS — Z79899 Other long term (current) drug therapy: Secondary | ICD-10-CM | POA: Insufficient documentation

## 2016-09-22 DIAGNOSIS — W19XXXA Unspecified fall, initial encounter: Secondary | ICD-10-CM | POA: Insufficient documentation

## 2016-09-22 DIAGNOSIS — Y999 Unspecified external cause status: Secondary | ICD-10-CM | POA: Insufficient documentation

## 2016-09-22 DIAGNOSIS — Y939 Activity, unspecified: Secondary | ICD-10-CM | POA: Insufficient documentation

## 2016-09-22 LAB — CBG MONITORING, ED
GLUCOSE-CAPILLARY: 67 mg/dL (ref 65–99)
Glucose-Capillary: 69 mg/dL (ref 65–99)
Glucose-Capillary: 95 mg/dL (ref 65–99)

## 2016-09-22 NOTE — ED Notes (Signed)
Pt ambulated from ED 

## 2016-09-22 NOTE — ED Notes (Signed)
Pt called and is not arousable

## 2016-09-22 NOTE — Discharge Instructions (Signed)
Recheck as needed °

## 2016-09-22 NOTE — ED Provider Notes (Signed)
[facility]-EM[facility]Y DEPT Provider Note   CSN: [id number] Arrival date & time: 09/22/16  0006  Time seen 02:20 AM   History   Chief Complaint Chief Complaint  Patient presents with  . Fall    HPI Lucas [name] is a 46 y.o. male.  HPI  patient is a frequent ED visitor. He has generally been homeless. It is snowing and cold tonight. Patient presents to the emergency department however he has mainly been sleeping. He is hard to keep awake to talk. He states he is "hurting all over". He denies any specific injury or problem. Patient also has a history of alcohol and drug abuse.  PCP none  Past Medical History:  Diagnosis Date  . Distal radius fracture, right 11/01/2015   comminuted  . Hyperlipemia     There are no active problems to display for this patient.   Past Surgical History:  Procedure Laterality Date  . CARPAL TUNNEL RELEASE Right 11/04/2015   Procedure: CARPAL TUNNEL RELEASE;  Surgeon: Dairl Ponder, MD;  Location: [facility];  Service: Orthopedics;  Laterality: Right;  . OPEN REDUCTION INTERNAL FIXATION (ORIF) DISTAL RADIAL FRACTURE Right 11/04/2015   Procedure: OPEN REDUCTION INTERNAL FIXATION (ORIF) DISTAL RADIAL FRACTURE ;  Surgeon: Dairl Ponder, MD;  Location: [facility];  Service: Orthopedics;  Laterality: Right;  . ORIF CLAVICLE FRACTURE Right 02/06/2014  . SHOULDER SURGERY Right        Home Medications    Prior to Admission medications   Medication Sig Start Date End Date Taking? Authorizing Provider  chlordiazePOXIDE (LIBRIUM) 25 MG capsule 50mg  PO TID x 1D, then 25-50mg  PO BID X 1D, then 25-50mg  PO QD X 1D 07/09/16   Lavera Guise, MD  cloNIDine (CATAPRES) 0.1 MG tablet Take 1 tablet (0.1 mg total) by mouth every 6 (six) hours as needed (withdrawl symtoms). 01/28/16   Azalia Bilis, MD  ondansetron (ZOFRAN ODT) 8 MG disintegrating tablet Take 1 tablet (8 mg total) by mouth every 8 (eight) hours as needed for nausea or  vomiting. 01/28/16   Azalia Bilis, MD    Family History History reviewed. No pertinent family history.  Social History Social History  Substance Use Topics  . Smoking status: Current Every Day Smoker    Packs/day: 0.75    Types: Cigarettes  . Smokeless tobacco: Never Used  . Alcohol use 2.4 oz/week    4 Cans of beer per week  Homeless   Allergies   Patient has no known allergies.   Review of Systems Review of Systems  Unable to perform ROS: Other     Physical Exam Updated Vital Signs Pulse (!) 53   Resp 12   Ht 5\' 6"  (1.676 m)   Wt 150 lb (68 kg)   SpO2 96%   BMI 24.21 kg/m   Vital signs normal except bradycardia   Physical Exam  Constitutional: He appears well-developed and well-nourished.  HENT:  Head: Normocephalic and atraumatic.  Right Ear: External ear normal.  Left Ear: External ear normal.  Nose: Nose normal.  Eyes: Conjunctivae and EOM are normal.  Neck: Normal range of motion.  Cardiovascular: Normal rate.   Pulmonary/Chest: Effort normal. No respiratory distress.  Musculoskeletal: Normal range of motion.  Pt is sleeping with all his extremities flexed to his body  Neurological:  Sleeping, arousable but falls back asleep  Skin: Skin is warm and dry.  Face flushed  Psychiatric:  Flat affect  Nursing note and vitals reviewed.    ED  Treatments / Results   LABS Results for orders placed or performed during the hospital encounter of 09/22/16  POC CBG, ED  Result Value Ref Range   Glucose-Capillary 67 65 - 99 mg/dL  CBG monitoring, ED  Result Value Ref Range   Glucose-Capillary 69 65 - 99 mg/dL  CBG monitoring, ED  Result Value Ref Range   Glucose-Capillary 95 65 - 99 mg/dL   Laboratory interpretation all normal except borderline glucose that improved     Procedures Procedures (including critical care time)  Medications Ordered in ED Medications - No data to display   Initial Impression / Assessment and Plan / ED Course  I  have reviewed the triage vital signs and the nursing notes.  Pertinent labs & imaging results that were available during my care of the patient were reviewed by me and considered in my medical decision making (see chart for details).  Patient CBG was borderline. He was given something to eat and drink in ED. His CBG improved and he was discharged home. I feel patient's main problem is he is homeless and there is bad weather tonight. He could never give me a specific reason for his ED visit tonight. He has had multiple ED visits for alcohol and substance abuse and being homeless.  Final Clinical Impressions(s) / ED Diagnoses   Final diagnoses:  Myalgia    Plan discharge  Devoria AlbeIva Cassandr Cederberg, MD, Concha PyoFACEP     Decker Cogdell, MD 09/22/16 [phone]

## 2016-09-22 NOTE — ED Notes (Signed)
Provided pt with a coke and crackers

## 2016-09-22 NOTE — ED Triage Notes (Signed)
Pt states he fell and is hurting all over; pt unable to stay awake during triage

## 2016-09-24 ENCOUNTER — Encounter (HOSPITAL_COMMUNITY): Payer: Self-pay

## 2016-09-24 ENCOUNTER — Emergency Department (HOSPITAL_COMMUNITY): Payer: Self-pay

## 2016-09-24 ENCOUNTER — Emergency Department (HOSPITAL_COMMUNITY)
Admission: EM | Admit: 2016-09-24 | Discharge: 2016-09-24 | Disposition: A | Discharge status: Home / Self Care | Payer: Self-pay | Attending: Emergency Medicine | Admitting: Emergency Medicine

## 2016-09-24 DIAGNOSIS — Z79899 Other long term (current) drug therapy: Secondary | ICD-10-CM | POA: Insufficient documentation

## 2016-09-24 DIAGNOSIS — R0789 Other chest pain: Secondary | ICD-10-CM

## 2016-09-24 DIAGNOSIS — F10920 Alcohol use, unspecified with intoxication, uncomplicated: Secondary | ICD-10-CM

## 2016-09-24 DIAGNOSIS — F10120 Alcohol abuse with intoxication, uncomplicated: Secondary | ICD-10-CM | POA: Insufficient documentation

## 2016-09-24 DIAGNOSIS — F17210 Nicotine dependence, cigarettes, uncomplicated: Secondary | ICD-10-CM | POA: Insufficient documentation

## 2016-09-24 LAB — RAPID URINE DRUG SCREEN, HOSP PERFORMED
Amphetamines: NOT DETECTED
BENZODIAZEPINES: POSITIVE — AB
Barbiturates: NOT DETECTED
Cocaine: POSITIVE — AB
OPIATES: NOT DETECTED
Tetrahydrocannabinol: POSITIVE — AB

## 2016-09-24 LAB — BASIC METABOLIC PANEL
ANION GAP: 9 (ref 5–15)
BUN: 7 mg/dL (ref 6–20)
CALCIUM: 8.6 mg/dL — AB (ref 8.9–10.3)
CO2: 27 mmol/L (ref 22–32)
Chloride: 105 mmol/L (ref 101–111)
Creatinine, Ser: 0.8 mg/dL (ref 0.61–1.24)
Glucose, Bld: 87 mg/dL (ref 65–99)
POTASSIUM: 3.9 mmol/L (ref 3.5–5.1)
Sodium: 141 mmol/L (ref 135–145)

## 2016-09-24 LAB — CBC
HCT: 52.3 % — ABNORMAL HIGH (ref 39.0–52.0)
HEMOGLOBIN: 16 g/dL (ref 13.0–17.0)
MCH: 33.8 pg (ref 26.0–34.0)
MCHC: 30.6 g/dL (ref 30.0–36.0)
MCV: 110.6 fL — ABNORMAL HIGH (ref 78.0–100.0)
Platelets: 189 10*3/uL (ref 150–400)
RBC: 4.73 MIL/uL (ref 4.22–5.81)
RDW: 15.4 % (ref 11.5–15.5)
WBC: 7.5 10*3/uL (ref 4.0–10.5)

## 2016-09-24 LAB — I-STAT TROPONIN, ED: TROPONIN I, POC: 0 ng/mL (ref 0.00–0.08)

## 2016-09-24 LAB — TROPONIN I
Troponin I: <0.03 ng/mL (ref <0.03)
Troponin I: <0.03 ng/mL (ref <0.03)

## 2016-09-24 LAB — ETHANOL: ALCOHOL ETHYL (B): 200 mg/dL — AB (ref <5)

## 2016-09-24 NOTE — ED Notes (Signed)
Patient ate breakfast which included coffee and juice.

## 2016-09-24 NOTE — ED Triage Notes (Signed)
Pt initially checked in at the registration clerk and stated "I'm fucking freezing" and sat down in a chair in the lobby.  When security went to speak with the pt and tell him he couldn't loiter in the waiting area as he did the night before, the patient then said he needed to see a doctor.  When this nurse approached the patient in the waiting area and asked what his medical complaint was, the patient said "I have a cold"    Pt then stated " I think I have pneumonia and my chest is hurting"  Pt brought back to tx room and ekg obtained.

## 2016-09-24 NOTE — ED Provider Notes (Signed)
Pt received at sign out with repeat troponin pending. 3 troponins negative. No acute changes on EKG. Well known to the ED for homelessness, polysubstance abuse. Pt has ambulated with steady gait, easy resps, NAD. Pt has ate a meal without N/V. Will d/c home stable.    Samuel JesterKathleen Linley Moxley, DO 09/24/16 [phone]

## 2016-09-24 NOTE — ED Notes (Signed)
Patient ambulated  to restroom without assistance.
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
## 2016-09-24 NOTE — Discharge Instructions (Signed)
There is no evidence of heart attack. Stop using cocaine and drinking alcohol. Establish care with a primary doctor. Return to the ED if you develop new or worsening symptoms.

## 2016-11-22 ENCOUNTER — Emergency Department (HOSPITAL_COMMUNITY): Admission: EM | Admit: 2016-11-22 | Discharge: 2016-11-22 | Discharge status: Absent without leave | Payer: Self-pay

## 2016-11-22 NOTE — ED Notes (Signed)
Attempted to triage patient.  Pt was on phone when name was called.  Pt immediately hung up the phone and said "fuck this, I cant stay. I gotta go!"  Pt walked out of ed

## 2016-12-16 ENCOUNTER — Encounter (HOSPITAL_COMMUNITY): Payer: Self-pay | Admitting: *Deleted

## 2016-12-16 ENCOUNTER — Emergency Department (HOSPITAL_COMMUNITY): Payer: Self-pay

## 2016-12-16 ENCOUNTER — Other Ambulatory Visit: Payer: Self-pay

## 2016-12-16 ENCOUNTER — Emergency Department (HOSPITAL_COMMUNITY)
Admission: EM | Admit: 2016-12-16 | Discharge: 2016-12-16 | Disposition: A | Discharge status: Home / Self Care | Payer: Self-pay | Attending: Emergency Medicine | Admitting: Emergency Medicine

## 2016-12-16 DIAGNOSIS — R0789 Other chest pain: Secondary | ICD-10-CM | POA: Insufficient documentation

## 2016-12-16 DIAGNOSIS — F17210 Nicotine dependence, cigarettes, uncomplicated: Secondary | ICD-10-CM | POA: Insufficient documentation

## 2016-12-16 DIAGNOSIS — F1410 Cocaine abuse, uncomplicated: Secondary | ICD-10-CM | POA: Insufficient documentation

## 2016-12-16 DIAGNOSIS — Z79899 Other long term (current) drug therapy: Secondary | ICD-10-CM | POA: Insufficient documentation

## 2016-12-16 LAB — CBC WITH DIFFERENTIAL/PLATELET
BASOS ABS: 0 10*3/uL (ref 0.0–0.1)
Basophils Relative: 0 %
EOS PCT: 1 %
Eosinophils Absolute: 0.1 10*3/uL (ref 0.0–0.7)
HEMATOCRIT: 40.7 % (ref 39.0–52.0)
Hemoglobin: 14.2 g/dL (ref 13.0–17.0)
LYMPHS ABS: 2.5 10*3/uL (ref 0.7–4.0)
LYMPHS PCT: 28 %
MCH: 32.1 pg (ref 26.0–34.0)
MCHC: 34.9 g/dL (ref 30.0–36.0)
MCV: 91.9 fL (ref 78.0–100.0)
MONO ABS: 1 10*3/uL (ref 0.1–1.0)
Monocytes Relative: 11 %
NEUTROS ABS: 5.4 10*3/uL (ref 1.7–7.7)
Neutrophils Relative %: 60 %
PLATELETS: 193 10*3/uL (ref 150–400)
RBC: 4.43 MIL/uL (ref 4.22–5.81)
RDW: 12.1 % (ref 11.5–15.5)
WBC: 9 10*3/uL (ref 4.0–10.5)

## 2016-12-16 LAB — BASIC METABOLIC PANEL
ANION GAP: 11 (ref 5–15)
BUN: 19 mg/dL (ref 6–20)
CALCIUM: 9.9 mg/dL (ref 8.9–10.3)
CO2: 29 mmol/L (ref 22–32)
Chloride: 103 mmol/L (ref 101–111)
Creatinine, Ser: 0.83 mg/dL (ref 0.61–1.24)
GFR calc Af Amer: >60 mL/min (ref >60)
GLUCOSE: 97 mg/dL (ref 65–99)
Potassium: 3.7 mmol/L (ref 3.5–5.1)
Sodium: 143 mmol/L (ref 135–145)

## 2016-12-16 LAB — RAPID URINE DRUG SCREEN, HOSP PERFORMED
Amphetamines: NOT DETECTED
BARBITURATES: NOT DETECTED
BENZODIAZEPINES: NOT DETECTED
COCAINE: POSITIVE — AB
OPIATES: NOT DETECTED
Tetrahydrocannabinol: POSITIVE — AB

## 2016-12-16 LAB — TROPONIN I: Troponin I: <0.03 ng/mL (ref <0.03)

## 2016-12-16 LAB — D-DIMER, QUANTITATIVE: D-Dimer, Quant: 0.29 ug/mL-FEU (ref 0.00–0.50)

## 2016-12-16 MED ORDER — IBUPROFEN 400 MG PO TABS
400.0000 mg | ORAL_TABLET | Freq: Once | ORAL | Status: AC
  Administered 2016-12-16: 400 mg via ORAL
  Filled 2016-12-16: qty 1
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
## 2016-12-16 NOTE — Discharge Instructions (Signed)
There is no evidence of heart attack or blood clot in the lung. Stop using cocaine. Followup with a primary doctor. Return to the ED if you develop new or worsening symptoms.

## 2016-12-16 NOTE — ED Triage Notes (Signed)
Pt c/o chest hurting with cough and sneezing; pt has some nasal congestion

## 2016-12-26 ENCOUNTER — Emergency Department (HOSPITAL_COMMUNITY): Admission: EM | Admit: 2016-12-26 | Discharge: 2016-12-26 | Discharge status: Against Medical Advice | Payer: Self-pay

## 2016-12-26 NOTE — ED Triage Notes (Addendum)
Pt reports chest pain x 1 month- was evaluated here previously for same   Pt has shirt on inside out and is odoriferious  States he has no physician

## 2016-12-26 NOTE — ED Notes (Signed)
Took pt to a room 6, was triaging pt and beginning EKG. Pt stated, "last time I was here I tested positive for cocaine and they wouldn't give me any pain medication for my chest pain. I need pain medication" RN explained that it is very important we know if you have been doing cocaine and have chest pain because certain medication for your heart can kill you if we give them to you and you have been doing cocaine. PT became angry, said "you all give Percocets  out here like candy and those can kill you. Man, forget this I am Out of here" PT left without being triaged or seen by MD. Strong smell of ETOH to breath.

## 2016-12-27 ENCOUNTER — Encounter (HOSPITAL_COMMUNITY): Payer: Self-pay | Admitting: Emergency Medicine

## 2016-12-27 ENCOUNTER — Emergency Department (HOSPITAL_COMMUNITY)
Admission: EM | Admit: 2016-12-27 | Discharge: 2016-12-27 | Disposition: A | Discharge status: Home / Self Care | Payer: Self-pay | Attending: Emergency Medicine | Admitting: Emergency Medicine

## 2016-12-27 ENCOUNTER — Emergency Department (HOSPITAL_COMMUNITY): Payer: Self-pay

## 2016-12-27 DIAGNOSIS — F17210 Nicotine dependence, cigarettes, uncomplicated: Secondary | ICD-10-CM | POA: Insufficient documentation

## 2016-12-27 DIAGNOSIS — R0789 Other chest pain: Secondary | ICD-10-CM | POA: Insufficient documentation

## 2016-12-27 DIAGNOSIS — F1410 Cocaine abuse, uncomplicated: Secondary | ICD-10-CM | POA: Insufficient documentation

## 2016-12-27 LAB — TROPONIN I: Troponin I: <0.03 ng/mL (ref <0.03)

## 2016-12-27 LAB — ETHANOL: ALCOHOL ETHYL (B): 54 mg/dL — AB (ref <5)

## 2016-12-27 NOTE — ED Notes (Signed)
ekg given to Dr Wilkie AyeHorton

## 2016-12-27 NOTE — ED Triage Notes (Signed)
Pt states he has been having chest pain for 3 weeks.  Admits to doing cocaine 45 minutes PTA.  Also admits to drinking ETOH, taking xanax and Neurontin, and smoking marijuana today

## 2016-12-27 NOTE — ED Provider Notes (Signed)
[facility]-EMERGENCY DEPT Provider Note   CSN: [id number] Arrival date & time: 12/27/16  0414     History   Chief Complaint Chief Complaint  Patient presents with  . Chest Pain    HPI Lucas [name] is a 46 y.o. male.  HPI  This is a 46 year old male well-known to emergency department with history of polysubstance abuse, alcohol abuse, who presents for chest pain. Patient reports a 3 week history of ongoing constant chest pain. When asked to describe it he states "I feel like I'm having a heart attack." He was seen last week for the same. At that time he had a reassuring workup. He does report drinking alcohol tonight and using cocaine 45 minutes prior to arrival. Patient provides limited additional history. Denies fevers, cough or shortness of breath.  Past Medical History:  Diagnosis Date  . Distal radius fracture, right 11/01/2015   comminuted  . Hyperlipemia     There are no active problems to display for this patient.   Past Surgical History:  Procedure Laterality Date  . CARPAL TUNNEL RELEASE Right 11/04/2015   Procedure: CARPAL TUNNEL RELEASE;  Surgeon: Dairl PonderMatthew Weingold, MD;  Location: [facility];  Service: Orthopedics;  Laterality: Right;  . OPEN REDUCTION INTERNAL FIXATION (ORIF) DISTAL RADIAL FRACTURE Right 11/04/2015   Procedure: OPEN REDUCTION INTERNAL FIXATION (ORIF) DISTAL RADIAL FRACTURE ;  Surgeon: Dairl PonderMatthew Weingold, MD;  Location: [facility];  Service: Orthopedics;  Laterality: Right;  . ORIF CLAVICLE FRACTURE Right 02/06/2014  . SHOULDER SURGERY Right        Home Medications    Prior to Admission medications   Medication Sig Start Date End Date Taking? Authorizing Provider  chlordiazePOXIDE (LIBRIUM) 25 MG capsule 50mg  PO TID x 1D, then 25-50mg  PO BID X 1D, then 25-50mg  PO QD X 1D Patient not taking: Reported on 09/24/2016 07/09/16   Lavera GuiseLiu, Dana Duo, MD  cloNIDine (CATAPRES) 0.1 MG tablet Take 1 tablet (0.1 mg total) by mouth every  6 (six) hours as needed (withdrawl symtoms). Patient not taking: Reported on 09/24/2016 01/28/16   Azalia Bilisampos, Kevin, MD  ondansetron (ZOFRAN ODT) 8 MG disintegrating tablet Take 1 tablet (8 mg total) by mouth every 8 (eight) hours as needed for nausea or vomiting. Patient not taking: Reported on 09/24/2016 01/28/16   Azalia Bilisampos, Kevin, MD    Family History No family history on file.  Social History Social History  Substance Use Topics  . Smoking status: Current Every Day Smoker    Packs/day: 0.75    Types: Cigarettes  . Smokeless tobacco: Never Used  . Alcohol use 2.4 oz/week    4 Cans of beer per week     Allergies   Patient has no known allergies.   Review of Systems Review of Systems  Constitutional: Negative for fever.  Respiratory: Negative for shortness of breath.   Cardiovascular: Positive for chest pain. Negative for leg swelling.  Gastrointestinal: Negative for nausea and vomiting.  All other systems reviewed and are negative.    Physical Exam Updated Vital Signs BP (!) 125/96   Pulse 70   Resp (!) 21   Ht 5\' 6"  (1.676 m)   Wt 61.2 kg (135 lb)   SpO2 99%   BMI 21.79 kg/m   Physical Exam  Constitutional: He is oriented to person, place, and time. No distress.  Disheveled appearing, somnolent but arousable and oriented, no acute distress  HENT:  Head: Normocephalic and atraumatic.  Cardiovascular: Normal rate, regular rhythm and normal  heart sounds.   No murmur heard. Pulmonary/Chest: Effort normal. No respiratory distress. He has wheezes.  Scant expiratory wheeze  Abdominal: Soft. There is no tenderness.  Musculoskeletal: He exhibits no edema.  Neurological: He is alert and oriented to person, place, and time.  Skin: Skin is warm and dry.  Psychiatric: He has a normal mood and affect.  Nursing note and vitals reviewed.    ED Treatments / Results  Labs (all labs ordered are listed, but only abnormal results are displayed) Labs Reviewed  ETHANOL -  Abnormal; Notable for the following:       Result Value   Alcohol, Ethyl (B) 54 (*)    All other components within normal limits  TROPONIN I    EKG  EKG Interpretation  Date/Time:  Sunday December 27 2016 04:29:58 EDT Ventricular Rate:  92 PR Interval:    QRS Duration: 92 QT Interval:  381 QTC Calculation: 472 R Axis:   90 Text Interpretation:  Sinus rhythm Right atrial enlargement Borderline right axis deviation RSR' in V1 or V2, probably normal variant ST elev, probable normal early repol pattern Similar to Mar 2018.  More prominent T waves Confirmed by Ross Marcus ([id number]) on 12/27/2016 4:40:02 AM       Radiology Dg Chest 2 View  Result Date: 12/27/2016 CLINICAL DATA:  Subacute onset of generalized chest pain. Initial encounter. EXAM: CHEST  2 VIEW COMPARISON:  Chest radiograph performed 12/16/2016 FINDINGS: The lungs are well-aerated. Mild peribronchial thickening is noted. There is no evidence of focal opacification, pleural effusion or pneumothorax. The heart is normal in size; the mediastinal contour is within normal limits. No acute osseous abnormalities are seen. A plate and screws are seen along the distal right clavicle. IMPRESSION: Mild peribronchial thickening noted. Lungs otherwise grossly clear. No displaced rib fractures identified. Electronically Signed   By: Roanna Raider M.D.   On: 12/27/2016 05:03    Procedures Procedures (including critical care time)  Medications Ordered in ED Medications - No data to display   Initial Impression / Assessment and Plan / ED Course  I have reviewed the triage vital signs and the nursing notes.  Pertinent labs & imaging results that were available during my care of the patient were reviewed by me and considered in my medical decision making (see chart for details).    Patient presents with chest pain in the setting of cocaine use. He is somnolent but arousable and oriented. Reports he hasn't slept in several days. He has  had ongoing pain for 3 weeks. He was seen last week for the same and had a reassuring workup. EKG is nonischemic. Chest x-ray is reassuring. Troponin is negative. Feel this is enough to effectively rule out ACS given ongoing nature of pain.   After history, exam, and medical workup I feel the patient has been appropriately medically screened and is safe for discharge home. Pertinent diagnoses were discussed with the patient. Patient was given return precautions.   Final Clinical Impressions(s) / ED Diagnoses   Final diagnoses:  Atypical chest pain  Cocaine abuse    New Prescriptions New Prescriptions   No medications on file     Shon Baton, MD 12/27/16 [phone]

## 2017-01-18 ENCOUNTER — Emergency Department (HOSPITAL_COMMUNITY)
Admission: EM | Admit: 2017-01-18 | Discharge: 2017-01-18 | Disposition: A | Discharge status: Home / Self Care | Payer: Self-pay | Attending: Emergency Medicine | Admitting: Emergency Medicine

## 2017-01-18 ENCOUNTER — Emergency Department (HOSPITAL_COMMUNITY): Payer: Self-pay

## 2017-01-18 ENCOUNTER — Encounter (HOSPITAL_COMMUNITY): Payer: Self-pay | Admitting: Emergency Medicine

## 2017-01-18 DIAGNOSIS — F1010 Alcohol abuse, uncomplicated: Secondary | ICD-10-CM | POA: Insufficient documentation

## 2017-01-18 DIAGNOSIS — S0990XA Unspecified injury of head, initial encounter: Secondary | ICD-10-CM

## 2017-01-18 DIAGNOSIS — Z23 Encounter for immunization: Secondary | ICD-10-CM | POA: Insufficient documentation

## 2017-01-18 DIAGNOSIS — S0001XA Abrasion of scalp, initial encounter: Secondary | ICD-10-CM | POA: Insufficient documentation

## 2017-01-18 DIAGNOSIS — F17210 Nicotine dependence, cigarettes, uncomplicated: Secondary | ICD-10-CM | POA: Insufficient documentation

## 2017-01-18 DIAGNOSIS — Y929 Unspecified place or not applicable: Secondary | ICD-10-CM | POA: Insufficient documentation

## 2017-01-18 DIAGNOSIS — Y939 Activity, unspecified: Secondary | ICD-10-CM | POA: Insufficient documentation

## 2017-01-18 DIAGNOSIS — W19XXXA Unspecified fall, initial encounter: Secondary | ICD-10-CM

## 2017-01-18 DIAGNOSIS — Y999 Unspecified external cause status: Secondary | ICD-10-CM | POA: Insufficient documentation

## 2017-01-18 DIAGNOSIS — W010XXA Fall on same level from slipping, tripping and stumbling without subsequent striking against object, initial encounter: Secondary | ICD-10-CM | POA: Insufficient documentation

## 2017-01-18 HISTORY — DX: Other psychoactive substance abuse, uncomplicated: F19.10

## 2017-01-18 HISTORY — DX: Alcohol abuse, uncomplicated: F10.10

## 2017-01-18 MED ORDER — TETANUS-DIPHTH-ACELL PERTUSSIS 5-2.5-18.5 LF-MCG/0.5 IM SUSP
0.5000 mL | Freq: Once | INTRAMUSCULAR | Status: AC
  Administered 2017-01-18: 0.5 mL via INTRAMUSCULAR
  Filled 2017-01-18: qty 0.5

## 2017-01-18 NOTE — ED Triage Notes (Signed)
Pt drinking last night. C/o tripping and falling and hit back of head. Pt c/o headache. Denies dizziness or loc. Pt requested us to call probation officer bc his apt with them was 9am

## 2017-01-18 NOTE — Discharge Instructions (Signed)
Wash the scalp wound area with soap and water at least twice a day. Whenever the area becomes wet or soiled, wash the area with soap and water.  Call your regular medical doctor today to schedule a follow up appointment within the next 2 to 3 days.   Return to the Emergency Department immediately sooner if worsening.

## 2017-01-18 NOTE — ED Notes (Signed)
Pt not very cooperative with treatment.  Rolled over on left side and mumbles answers to questions.  Had to repeatedly as for clarification as to what pt was stating.

## 2017-01-18 NOTE — ED Notes (Signed)
Pt asked for pain medication before he left. I advised him that I would go ask doctor. Stated never mind and then proceeded to leave.

## 2017-01-18 NOTE — ED Provider Notes (Signed)
[facility] DEPT Provider Note   CSN: [id number] Arrival date & time: 01/18/17  0901     History   Chief Complaint Chief [id number]  Patient presents with  . Fall    HPI Lucas [name] is a 46 y.o. male.  HPI  Pt was seen at 0920. Per pt, c/o sudden onset and resolution of one episode of trip and fall that occurred last evening. Pt states he was drinking etoh last night when he tripped and fell backwards, hitting his head. Pt c/o headache today. Pt states he wants the ED to call his Probation Officer because he was supposed to be there at 9am.  Denies LOC, no neck or back pain, no CP/SOB, no abd pain, no focal motor weakness, no tingling/numbness in extremities.    Td unknown Past Medical History:  Diagnosis Date  . Alcohol abuse   . Distal radius fracture, right 11/01/2015   comminuted  . Hyperlipemia   . Polysubstance abuse    cocaine, etoh, opiates    There are no active problems to display for this patient.   Past Surgical History:  Procedure Laterality Date  . CARPAL TUNNEL RELEASE Right 11/04/2015   Procedure: CARPAL TUNNEL RELEASE;  Surgeon: Dairl PonderMatthew Weingold, MD;  Location: [facility];  Service: Orthopedics;  Laterality: Right;  . OPEN REDUCTION INTERNAL FIXATION (ORIF) DISTAL RADIAL FRACTURE Right 11/04/2015   Procedure: OPEN REDUCTION INTERNAL FIXATION (ORIF) DISTAL RADIAL FRACTURE ;  Surgeon: Dairl PonderMatthew Weingold, MD;  Location: [facility];  Service: Orthopedics;  Laterality: Right;  . ORIF CLAVICLE FRACTURE Right 02/06/2014  . SHOULDER SURGERY Right        Home Medications    Prior to Admission medications   Medication Sig Start Date End Date Taking? Authorizing Provider  chlordiazePOXIDE (LIBRIUM) 25 MG capsule 50mg  PO TID x 1D, then 25-50mg  PO BID X 1D, then 25-50mg  PO QD X 1D Patient not taking: Reported on 09/24/2016 07/09/16   Lavera GuiseLiu, Dana Duo, MD  cloNIDine (CATAPRES) 0.1 MG tablet Take 1 tablet (0.1 mg total) by mouth every 6  (six) hours as needed (withdrawl symtoms). Patient not taking: Reported on 09/24/2016 01/28/16   Azalia Bilisampos, Kevin, MD  ondansetron (ZOFRAN ODT) 8 MG disintegrating tablet Take 1 tablet (8 mg total) by mouth every 8 (eight) hours as needed for nausea or vomiting. Patient not taking: Reported on 09/24/2016 01/28/16   Azalia Bilisampos, Kevin, MD    Family History History reviewed. No pertinent family history.  Social History Social History  Substance Use Topics  . Smoking status: Current Every Day Smoker    Packs/day: 0.75    Types: Cigarettes  . Smokeless tobacco: Never Used  . Alcohol use 2.4 oz/week    4 Cans of beer per week     Comment: used last 01/17/17     Allergies   Patient has no known allergies.   Review of Systems Review of Systems ROS: Statement: All systems negative except as marked or noted in the HPI; Constitutional: Negative for fever and chills. ; ; Eyes: Negative for eye pain, redness and discharge. ; ; ENMT: Negative for ear pain, hoarseness, nasal congestion, sinus pressure and sore throat. ; ; Cardiovascular: Negative for chest pain, palpitations, diaphoresis, dyspnea and peripheral edema. ; ; Respiratory: Negative for cough, wheezing and stridor. ; ; Gastrointestinal: Negative for nausea, vomiting, diarrhea, abdominal pain, blood in stool, hematemesis, jaundice and rectal bleeding. . ; ; Genitourinary: Negative for dysuria, flank pain and hematuria. ; ; Musculoskeletal: +head injury.  Negative for back pain and neck pain. Negative for swelling and deformity.; ; Skin: +abrasion. Negative for pruritus, rash, blisters, bruising and skin lesion.; ; Neuro: Negative for headache, lightheadedness and neck stiffness. Negative for weakness, altered level of consciousness, altered mental status, extremity weakness, paresthesias, involuntary movement, seizure and syncope.       Physical Exam Updated Vital Signs BP (!) 135/99 (BP Location: Left Arm)   Pulse 62   Temp 97.7 F (36.5 C)  (Oral)   Resp 16   SpO2 100%   Physical Exam 0925: Physical examination:  Nursing notes reviewed; Vital signs and O2 SAT reviewed;  Constitutional: Well developed, Well nourished, Well hydrated, In no acute distress; Head:  Normocephalic, +abrasion right posterior parietal scalp.; Eyes: EOMI, PERRL, No scleral icterus; ENMT: Mouth and pharynx normal, Mucous membranes moist; Neck: Supple, Full range of motion, No lymphadenopathy; Cardiovascular: Regular rate and rhythm, No gallop; Respiratory: Breath sounds clear & equal bilaterally, No wheezes.  Speaking full sentences with ease, Normal respiratory effort/excursion; Chest: Nontender, Movement normal; Abdomen: Soft, Nontender, Nondistended, Normal bowel sounds; Genitourinary: No CVA tenderness; Spine:  No midline CS, TS, LS tenderness.;; Extremities: Pulses normal, No tenderness, No edema, No calf edema or asymmetry.; Neuro: AA&Ox3, Major CN grossly intact.  Speech clear. No gross focal motor or sensory deficits in extremities.; Skin: Color normal, Warm, Dry.   ED Treatments / Results  Labs (all labs ordered are listed, but only abnormal results are displayed)   EKG  EKG Interpretation None       Radiology   Procedures Procedures (including critical care time)  Medications Ordered in ED Medications  Tdap (BOOSTRIX) injection 0.5 mL (not administered)     Initial Impression / Assessment and Plan / ED Course  I have reviewed the triage vital signs and the nursing notes.  Pertinent labs & imaging results that were available during my care of the patient were reviewed by me and considered in my medical decision making (see chart for details).  MDM Reviewed: previous chart, nursing note and vitals Interpretation: CT scan    Ct Head Wo Contrast Result Date: 01/18/2017 CLINICAL DATA:  Tripping and falling EXAM: CT HEAD WITHOUT CONTRAST TECHNIQUE: Contiguous axial images were obtained from the base of the skull through the vertex  without intravenous contrast. COMPARISON:  None. FINDINGS: Brain: No evidence of acute infarction, hemorrhage, hydrocephalus, extra-axial collection or mass lesion/mass effect. Vascular: No hyperdense vessel or unexpected calcification. Skull: No osseous abnormality. Sinuses/Orbits: Small right maxillary sinus air-fluid level. Bilateral ethmoid sinus mucosal thickening. Visualized mastoid sinuses are clear. Visualized orbits demonstrate no focal abnormality. Other: None IMPRESSION: No acute intracranial pathology. Electronically Signed   By: Elige Ko   On: 01/18/2017 11:12     1115:  Pt has slept most of his ED visit; pt is now awake/alert. CT reassuring. Td updated. Wound care provided (fall occurred some time last evening, possibly 8pm, per pt). Pt states he will call for a ride home. Dx and testing d/w pt.  Questions answered.  Verb understanding, agreeable to d/c home with outpt f/u.   Final Clinical Impressions(s) / ED Diagnoses   Final diagnoses:  None    New Prescriptions New Prescriptions   No medications on file     Samuel Jester, DO 01/22/17 1546

## 2017-02-11 ENCOUNTER — Emergency Department (HOSPITAL_COMMUNITY)
Admission: EM | Admit: 2017-02-11 | Discharge: 2017-02-12 | Disposition: A | Discharge status: Home / Self Care | Payer: Self-pay | Attending: Emergency Medicine | Admitting: Emergency Medicine

## 2017-02-11 DIAGNOSIS — Z590 Homelessness: Secondary | ICD-10-CM | POA: Insufficient documentation

## 2017-02-11 DIAGNOSIS — Z5321 Procedure and treatment not carried out due to patient leaving prior to being seen by health care provider: Secondary | ICD-10-CM | POA: Insufficient documentation

## 2017-02-12 ENCOUNTER — Encounter (HOSPITAL_COMMUNITY): Payer: Self-pay

## 2017-02-12 NOTE — ED Notes (Signed)
After triage, and during the time pt was being hooked up to the cardiac monitor the patient asked the NT what time it was and when she told him, he started pulling the ekg leads off of his chest and said "I'm done, sorry to waste your time"  And walked out of the treatment room.  Pt kept walking out of the e.d..Marland Kitchen

## 2017-02-12 NOTE — ED Triage Notes (Signed)
Pt arrives with c/o pain in his chest that he reports as "arthritis pain" states he is homeless.  Pt states the pain started this evening and states he has had it several times before.

## 2017-04-24 ENCOUNTER — Emergency Department (HOSPITAL_COMMUNITY)
Admission: EM | Admit: 2017-04-24 | Discharge: 2017-04-24 | Disposition: A | Discharge status: Home / Self Care | Payer: Self-pay

## 2017-09-20 ENCOUNTER — Emergency Department (HOSPITAL_COMMUNITY)
Admission: EM | Admit: 2017-09-20 | Discharge: 2017-09-20 | Disposition: A | Discharge status: Home / Self Care | Payer: Self-pay

## 2018-03-23 ENCOUNTER — Other Ambulatory Visit: Payer: Self-pay

## 2018-03-23 ENCOUNTER — Emergency Department (HOSPITAL_COMMUNITY): Payer: Self-pay

## 2018-03-23 ENCOUNTER — Encounter (HOSPITAL_COMMUNITY): Payer: Self-pay | Admitting: Emergency Medicine

## 2018-03-23 ENCOUNTER — Emergency Department (HOSPITAL_COMMUNITY)
Admission: EM | Admit: 2018-03-23 | Discharge: 2018-03-23 | Disposition: A | Discharge status: Home / Self Care | Payer: Self-pay | Attending: Emergency Medicine | Admitting: Emergency Medicine

## 2018-03-23 ENCOUNTER — Emergency Department (HOSPITAL_COMMUNITY)
Admission: EM | Admit: 2018-03-23 | Discharge: 2018-03-23 | Disposition: A | Discharge status: Home / Self Care | Payer: Self-pay

## 2018-03-23 DIAGNOSIS — R0789 Other chest pain: Secondary | ICD-10-CM | POA: Insufficient documentation

## 2018-03-23 DIAGNOSIS — F17210 Nicotine dependence, cigarettes, uncomplicated: Secondary | ICD-10-CM | POA: Insufficient documentation

## 2018-03-23 DIAGNOSIS — F1910 Other psychoactive substance abuse, uncomplicated: Secondary | ICD-10-CM | POA: Insufficient documentation

## 2018-03-23 LAB — BASIC METABOLIC PANEL
Anion gap: 12 (ref 5–15)
BUN: 9 mg/dL (ref 6–20)
CALCIUM: 8.3 mg/dL — AB (ref 8.9–10.3)
CO2: 21 mmol/L — ABNORMAL LOW (ref 22–32)
CREATININE: 0.98 mg/dL (ref 0.61–1.24)
Chloride: 108 mmol/L (ref 98–111)
Glucose, Bld: 99 mg/dL (ref 70–99)
Potassium: 3.6 mmol/L (ref 3.5–5.1)
SODIUM: 141 mmol/L (ref 135–145)

## 2018-03-23 LAB — CBC
HCT: 41.8 % (ref 39.0–52.0)
Hemoglobin: 14.6 g/dL (ref 13.0–17.0)
MCH: 31.8 pg (ref 26.0–34.0)
MCHC: 34.9 g/dL (ref 30.0–36.0)
MCV: 91.1 fL (ref 78.0–100.0)
Platelets: 203 10*3/uL (ref 150–400)
RBC: 4.59 MIL/uL (ref 4.22–5.81)
RDW: 13.2 % (ref 11.5–15.5)
WBC: 8.4 10*3/uL (ref 4.0–10.5)

## 2018-03-23 LAB — I-STAT TROPONIN, ED: TROPONIN I, POC: 0 ng/mL (ref 0.00–0.08)

## 2018-03-23 MED ORDER — AMMONIA AROMATIC IN INHA
0.3000 mL | Freq: Once | RESPIRATORY_TRACT | Status: AC
  Administered 2018-03-23: 0.3 mL via RESPIRATORY_TRACT

## 2018-03-23 MED ORDER — AMMONIA AROMATIC IN INHA
RESPIRATORY_TRACT | Status: AC
  Administered 2018-03-23: 0.3 mL via RESPIRATORY_TRACT
  Filled 2018-03-23: qty 10

## 2018-03-23 NOTE — ED Triage Notes (Signed)
Patient is having chest pain, has admitted to using cocaine today.

## 2018-03-23 NOTE — ED Notes (Signed)
Pt being discharged. Pt refused to sign. escorted out by security

## 2018-03-23 NOTE — ED Provider Notes (Signed)
[facility] EMERGENCY DEPARTMENT Provider Note   CSN: [id number] Arrival date & time: 03/23/18  0123  Time seen 02:35 AM   History   Chief Complaint Chief Complaint  Patient presents with  . Chest Pain     HPI Lucas [name] is a 47 y.o. male.  HPI    patient is sleeping soundly when I enter the room.  When I tried to keep him awake he tells me to leave him alone.  I have explained to him that he cannot just come to the ED to sleep.  He states he started having chest pain last night about 10 PM.  He states it is in the left and sort of generally points to his left chest.  He states it makes him feel short of breath however he has no signs of being short of breath.  He denies nausea or vomiting.  Reviewing patient's prior visits he has multiple visits for atypical chest pain.  He also has polysubstance abuse.  Patient told nursing staff he had been using cocaine tonight.  PCP Patient, No Pcp Per   Past Medical History:  Diagnosis Date  . Alcohol abuse   . Distal radius fracture, right 11/01/2015   comminuted  . Hyperlipemia   . Polysubstance abuse (HCC)    cocaine, etoh, opiates    There are no active problems to display for this patient.   Past Surgical History:  Procedure Laterality Date  . CARPAL TUNNEL RELEASE Right 11/04/2015   Procedure: CARPAL TUNNEL RELEASE;  Surgeon: Dairl Ponder, MD;  Location: [facility];  Service: Orthopedics;  Laterality: Right;  . OPEN REDUCTION INTERNAL FIXATION (ORIF) DISTAL RADIAL FRACTURE Right 11/04/2015   Procedure: OPEN REDUCTION INTERNAL FIXATION (ORIF) DISTAL RADIAL FRACTURE ;  Surgeon: Dairl Ponder, MD;  Location: [facility];  Service: Orthopedics;  Laterality: Right;  . ORIF CLAVICLE FRACTURE Right 02/06/2014  . SHOULDER SURGERY Right         Home Medications    Prior to Admission medications   Not on File    Family History History reviewed. No pertinent family history.  Social  History Social History   Tobacco Use  . Smoking status: Current Every Day Smoker    Packs/day: 0.75    Types: Cigarettes  . Smokeless tobacco: Never Used  Substance Use Topics  . Alcohol use: Yes    Alcohol/week: 4.0 standard drinks    Types: 4 Cans of beer per week    Comment: used last 01/17/17  . Drug use: Yes    Types: Cocaine, Marijuana, Methamphetamines    Comment: cocaine used 01/17/17     Allergies   Patient has no known allergies.   Review of Systems Review of Systems  All other systems reviewed and are negative.    Physical Exam Updated Vital Signs Ht 5\' 6"  (1.676 m)   Wt 68 kg   BMI 24.21 kg/m   Physical Exam  Constitutional: He is oriented to person, place, and time. He appears well-developed and well-nourished.  Non-toxic appearance. He does not appear ill. No distress.  Patient sleeping soundly in no distress.  HENT:  Head: Normocephalic and atraumatic.  Right Ear: External ear normal.  Left Ear: External ear normal.  Nose: Nose normal. No mucosal edema or rhinorrhea.  Mouth/Throat: Oropharynx is clear and moist and mucous membranes are normal. No dental abscesses or uvula swelling.  Eyes: Pupils are equal, round, and reactive to light. Conjunctivae and EOM are normal.  Neck:  Normal range of motion and full passive range of motion without pain. Neck supple.  Cardiovascular: Normal rate, regular rhythm and normal heart sounds. Exam reveals no gallop and no friction rub.  No murmur heard. Pulmonary/Chest: Effort normal and breath sounds normal. No respiratory distress. He has no wheezes. He has no rhonchi. He has no rales. He exhibits no tenderness and no crepitus.  Abdominal: Soft. Normal appearance and bowel sounds are normal. He exhibits no distension. There is no tenderness. There is no rebound and no guarding.  Musculoskeletal: Normal range of motion. He exhibits no edema or tenderness.  Moves all extremities well.   Neurological: He is alert and  oriented to person, place, and time. He has normal strength. No cranial nerve deficit.  Skin: Skin is warm, dry and intact. No rash noted. No erythema. No pallor.  Psychiatric: He has a normal mood and affect. His speech is normal and behavior is normal. His mood appears not anxious.  Nursing note and vitals reviewed.    ED Treatments / Results  Labs (all labs ordered are listed, but only abnormal results are displayed)  Results for orders placed or performed during the hospital encounter of 03/23/18  Basic metabolic panel  Result Value Ref Range   Sodium 141 135 - 145 mmol/L   Potassium 3.6 3.5 - 5.1 mmol/L   Chloride 108 98 - 111 mmol/L   CO2 21 (L) 22 - 32 mmol/L   Glucose, Bld 99 70 - 99 mg/dL   BUN 9 6 - 20 mg/dL   Creatinine, Ser 1.61 0.61 - 1.24 mg/dL   Calcium 8.3 (L) 8.9 - 10.3 mg/dL   GFR calc non Af Amer >60 >60 mL/min   GFR calc Af Amer >60 >60 mL/min   Anion gap 12 5 - 15  CBC  Result Value Ref Range   WBC 8.4 4.0 - 10.5 K/uL   RBC 4.59 4.22 - 5.81 MIL/uL   Hemoglobin 14.6 13.0 - 17.0 g/dL   HCT 09.6 04.5 - 40.9 %   MCV 91.1 78.0 - 100.0 fL   MCH 31.8 26.0 - 34.0 pg   MCHC 34.9 30.0 - 36.0 g/dL   RDW 81.1 91.4 - 78.2 %   Platelets 203 150 - 400 K/uL  I-stat troponin, ED  Result Value Ref Range   Troponin i, poc 0.00 0.00 - 0.08 ng/mL   Comment 3           Laboratory interpretation all normal    EKG EKG Interpretation  Date/Time:  Wednesday March 23 2018 01:37:57 EDT Ventricular Rate:  91 PR Interval:    QRS Duration: 94 QT Interval:  349 QTC Calculation: 430 R Axis:   90 Text Interpretation:  Sinus rhythm Borderline right axis deviation No significant change since last tracing 27 Dec 2016 Confirmed by Devoria Albe ([id number]) on 03/23/2018 1:54:03 AM   Radiology Dg Chest 2 View  Result Date: 03/23/2018 CLINICAL DATA:  Chest pain, smoker, admits to cocaine use today EXAM: CHEST - 2 VIEW COMPARISON:  01/05/2018 FINDINGS: Normal heart size,  mediastinal contours, and pulmonary vascularity. Minimal chronic central peribronchial thickening. Lungs otherwise clear. No infiltrate, pleural effusion or pneumothorax. No acute osseous findings. Prior RIGHT clavicular ORIF. IMPRESSION: No acute abnormalities. Electronically Signed   By: Ulyses Southward M.D.   On: 03/23/2018 02:10    Procedures Procedures (including critical care time)  Medications Ordered in ED Medications  ammonia inhalant 0.3 mL (0.3 mLs Inhalation Given 03/23/18 0245)  Initial Impression / Assessment and Plan / ED Course  I have reviewed the triage vital signs and the nursing notes.  Pertinent labs & imaging results that were available during my care of the patient were reviewed by me and considered in my medical decision making (see chart for details).     Patient presents with chest pain after cocaine use.  He is sound asleep.  When I told him I was good discharge and he goes "but what if I die".  I told him he did not seem to be worried about dying because he was sleeping with no problem.  Patient seems to be malingering at this point.  He was discharged from the ED.    Final Clinical Impressions(s) / ED Diagnoses   Final diagnoses:  Atypical chest pain    ED Discharge Orders    None     Plan discharge  Devoria Albe, MD, Concha Pyo, MD 03/23/18 [phone]

## 2018-03-30 ENCOUNTER — Emergency Department (HOSPITAL_COMMUNITY): Admission: EM | Admit: 2018-03-30 | Discharge: 2018-03-30 | Discharge status: Against Medical Advice | Payer: Self-pay

## 2018-04-11 ENCOUNTER — Other Ambulatory Visit: Payer: Self-pay

## 2018-04-11 ENCOUNTER — Emergency Department (HOSPITAL_COMMUNITY): Payer: Self-pay

## 2018-04-11 ENCOUNTER — Emergency Department (HOSPITAL_COMMUNITY)
Admission: EM | Admit: 2018-04-11 | Discharge: 2018-04-12 | Disposition: A | Discharge status: Home / Self Care | Payer: Self-pay | Attending: Emergency Medicine | Admitting: Emergency Medicine

## 2018-04-11 ENCOUNTER — Encounter (HOSPITAL_COMMUNITY): Payer: Self-pay | Admitting: *Deleted

## 2018-04-11 DIAGNOSIS — R079 Chest pain, unspecified: Secondary | ICD-10-CM | POA: Insufficient documentation

## 2018-04-11 DIAGNOSIS — F17210 Nicotine dependence, cigarettes, uncomplicated: Secondary | ICD-10-CM | POA: Insufficient documentation

## 2018-04-11 NOTE — ED Provider Notes (Signed)
[facility] EMERGENCY DEPARTMENT Provider Note   CSN: [id number] Arrival date & time: 04/11/18  2322     History   Chief Complaint Chief Complaint  Patient presents with  . Chest Pain    HPI Lucas [name] is a 47 y.o. male.  Patient presents with chest pain.  He has had multiple visits recently for the same.  Patient admits to cocaine abuse.  He reports that tonight he coughed and then started having left-sided chest pain, then became very anxious.  Reports that the pain is worse than it has been in the past.  No shortness of breath.     Past Medical History:  Diagnosis Date  . Alcohol abuse   . Distal radius fracture, right 11/01/2015   comminuted  . Hyperlipemia   . Polysubstance abuse (HCC)    cocaine, etoh, opiates    There are no active problems to display for this patient.   Past Surgical History:  Procedure Laterality Date  . CARPAL TUNNEL RELEASE Right 11/04/2015   Procedure: CARPAL TUNNEL RELEASE;  Surgeon: Dairl Ponder, MD;  Location: [facility];  Service: Orthopedics;  Laterality: Right;  . OPEN REDUCTION INTERNAL FIXATION (ORIF) DISTAL RADIAL FRACTURE Right 11/04/2015   Procedure: OPEN REDUCTION INTERNAL FIXATION (ORIF) DISTAL RADIAL FRACTURE ;  Surgeon: Dairl Ponder, MD;  Location: [facility];  Service: Orthopedics;  Laterality: Right;  . ORIF CLAVICLE FRACTURE Right 02/06/2014  . SHOULDER SURGERY Right         Home Medications    Prior to Admission medications   Not on File    Family History History reviewed. No pertinent family history.  Social History Social History   Tobacco Use  . Smoking status: Current Every Day Smoker    Packs/day: 2.00    Types: Cigarettes  . Smokeless tobacco: Never Used  Substance Use Topics  . Alcohol use: Yes    Alcohol/week: 4.0 standard drinks    Types: 4 Cans of beer per week    Comment: used last 01/17/17  . Drug use: Yes    Types: Cocaine, Marijuana,  Methamphetamines    Comment: cocaine used 01/17/17     Allergies   Patient has no known allergies.   Review of Systems Review of Systems  Cardiovascular: Positive for chest pain.  Psychiatric/Behavioral: The patient is nervous/anxious.   All other systems reviewed and are negative.    Physical Exam Updated Vital Signs BP (!) 159/107 (BP Location: Right Arm)   Pulse 71   Temp 98 F (36.7 C) (Oral)   Resp 12   Ht 5\' 6"  (1.676 m)   Wt 68 kg   SpO2 96%   BMI 24.21 kg/m   Physical Exam  Constitutional: He is oriented to person, place, and time. He appears well-developed and well-nourished. No distress.  HENT:  Head: Normocephalic and atraumatic.  Right Ear: Hearing normal.  Left Ear: Hearing normal.  Nose: Nose normal.  Mouth/Throat: Oropharynx is clear and moist and mucous membranes are normal.  Eyes: Pupils are equal, round, and reactive to light. Conjunctivae and EOM are normal.  Neck: Normal range of motion. Neck supple.  Cardiovascular: Regular rhythm, S1 normal and S2 normal. Exam reveals no gallop and no friction rub.  No murmur heard. Pulmonary/Chest: Effort normal and breath sounds normal. No respiratory distress. He exhibits no tenderness.  Abdominal: Soft. Normal appearance and bowel sounds are normal. There is no hepatosplenomegaly. There is no tenderness. There is no rebound, no guarding, no  tenderness at McBurney's point and negative Murphy's sign. No hernia.  Musculoskeletal: Normal range of motion.  Neurological: He is alert and oriented to person, place, and time. He has normal strength. No cranial nerve deficit or sensory deficit. Coordination normal. GCS eye subscore is 4. GCS verbal subscore is 5. GCS motor subscore is 6.  Skin: Skin is warm, dry and intact. No rash noted. No cyanosis.  Psychiatric: He has a normal mood and affect. His speech is normal and behavior is normal. Thought content normal.  Nursing note and vitals reviewed.    ED Treatments /  Results  Labs (all labs ordered are listed, but only abnormal results are displayed) Labs Reviewed  BASIC METABOLIC PANEL - Abnormal; Notable for the following components:      Result Value   Potassium 3.3 (*)    Glucose, Bld 102 (*)    Calcium 8.8 (*)    All other components within normal limits  CBC  I-STAT TROPONIN, ED    EKG EKG Interpretation  Date/Time:  Monday April 11 2018 23:41:37 EDT Ventricular Rate:  66 PR Interval:    QRS Duration: 92 QT Interval:  400 QTC Calculation: 420 R Axis:   89 Text Interpretation:  Sinus rhythm Abnormal T, consider ischemia, lateral leads No significant change since last tracing Confirmed by Gilda Crease [phone]) on 04/12/2018 1:11:46 AM   Radiology Dg Chest 2 View  Result Date: 04/12/2018 CLINICAL DATA:  Chest pain, shortness of breath EXAM: CHEST - 2 VIEW COMPARISON:  03/23/2018 FINDINGS: Lungs are clear.  No pleural effusion or pneumothorax. The heart is normal in size. Visualized osseous structures are within normal limits. IMPRESSION: Normal chest radiographs. Electronically Signed   By: Charline Bills M.D.   On: 04/12/2018 00:33    Procedures Procedures (including critical care time)  Medications Ordered in ED Medications - No data to display   Initial Impression / Assessment and Plan / ED Course  I have reviewed the triage vital signs and the nursing notes.  Pertinent labs & imaging results that were available during my care of the patient were reviewed by me and considered in my medical decision making (see chart for details).     Patient presents to the ER for evaluation of chest pain.  He has had multiple visits for chest pain.  It is unclear what the etiology of the chest pain is.  He does have cardiac risk factors and has endorsed use of cocaine.  No evidence of acute coronary syndrome at this visit.  EKG is unchanged today, first troponin negative.  Awaiting second troponin, but patient, decided he no  longer wanted to wait in the ER.  He was not impaired, had capacity to make this decision, so chose not to have a second troponin or further work-up.  He was discharged AGAINST MEDICAL ADVICE.  Final Clinical Impressions(s) / ED Diagnoses   Final diagnoses:  Chest pain, unspecified type    ED Discharge Orders    None       Jerad Dunlap, Canary Brim, MD 04/12/18 0221

## 2018-04-11 NOTE — ED Triage Notes (Signed)
Pt having central chest pain that started after he coughed tonight; pt has some sob

## 2018-04-12 LAB — BASIC METABOLIC PANEL
ANION GAP: 8 (ref 5–15)
BUN: 9 mg/dL (ref 6–20)
CALCIUM: 8.8 mg/dL — AB (ref 8.9–10.3)
CO2: 24 mmol/L (ref 22–32)
Chloride: 104 mmol/L (ref 98–111)
Creatinine, Ser: 0.79 mg/dL (ref 0.61–1.24)
GFR calc non Af Amer: >60 mL/min (ref >60)
GLUCOSE: 102 mg/dL — AB (ref 70–99)
POTASSIUM: 3.3 mmol/L — AB (ref 3.5–5.1)
SODIUM: 136 mmol/L (ref 135–145)

## 2018-04-12 LAB — CBC
HCT: 39.9 % (ref 39.0–52.0)
Hemoglobin: 13.7 g/dL (ref 13.0–17.0)
MCH: 31.5 pg (ref 26.0–34.0)
MCHC: 34.3 g/dL (ref 30.0–36.0)
MCV: 91.7 fL (ref 78.0–100.0)
Platelets: 190 10*3/uL (ref 150–400)
RBC: 4.35 MIL/uL (ref 4.22–5.81)
RDW: 13 % (ref 11.5–15.5)
WBC: 8.6 10*3/uL (ref 4.0–10.5)

## 2018-04-12 LAB — I-STAT TROPONIN, ED: Troponin i, poc: 0 ng/mL (ref 0.00–0.08)

## 2018-04-12 NOTE — ED Notes (Signed)
Pt stated that he was feeling better and did not want to stay.

## 2018-04-25 ENCOUNTER — Emergency Department (HOSPITAL_COMMUNITY)
Admission: EM | Admit: 2018-04-25 | Discharge: 2018-04-25 | Disposition: A | Discharge status: Home / Self Care | Payer: Self-pay | Attending: Emergency Medicine | Admitting: Emergency Medicine

## 2018-04-25 ENCOUNTER — Emergency Department (HOSPITAL_COMMUNITY): Payer: Self-pay

## 2018-04-25 ENCOUNTER — Encounter (HOSPITAL_COMMUNITY): Payer: Self-pay | Admitting: Emergency Medicine

## 2018-04-25 DIAGNOSIS — E785 Hyperlipidemia, unspecified: Secondary | ICD-10-CM | POA: Insufficient documentation

## 2018-04-25 DIAGNOSIS — Y998 Other external cause status: Secondary | ICD-10-CM | POA: Insufficient documentation

## 2018-04-25 DIAGNOSIS — W01198A Fall on same level from slipping, tripping and stumbling with subsequent striking against other object, initial encounter: Secondary | ICD-10-CM | POA: Insufficient documentation

## 2018-04-25 DIAGNOSIS — S300XXA Contusion of lower back and pelvis, initial encounter: Secondary | ICD-10-CM

## 2018-04-25 DIAGNOSIS — F17210 Nicotine dependence, cigarettes, uncomplicated: Secondary | ICD-10-CM | POA: Insufficient documentation

## 2018-04-25 DIAGNOSIS — Y939 Activity, unspecified: Secondary | ICD-10-CM | POA: Insufficient documentation

## 2018-04-25 DIAGNOSIS — S39012A Strain of muscle, fascia and tendon of lower back, initial encounter: Secondary | ICD-10-CM | POA: Insufficient documentation

## 2018-04-25 DIAGNOSIS — Y92008 Other place in unspecified non-institutional (private) residence as the place of occurrence of the external cause: Secondary | ICD-10-CM | POA: Insufficient documentation

## 2018-04-25 MED ORDER — METHOCARBAMOL 500 MG PO TABS: 500.0000 mg | ORAL_TABLET | Freq: Three times a day (TID) | ORAL | 0 refills | Status: AC | PRN

## 2018-04-25 NOTE — ED Triage Notes (Signed)
Pt states he slipped on his wet porch late last night/early this morning and has been having lower back pain since.

## 2018-04-25 NOTE — ED Provider Notes (Signed)
[facility] EMERGENCY DEPARTMENT Provider Note   CSN: [id number] Arrival date & time: 04/25/18  1004     History   Chief Complaint Chief Complaint  Patient presents with  . Back Pain  . Fall    HPI Lucas [name] is a 47 y.o. male.  HPI Patient presents with low back pain after fall.  States he stopped on his porch and hit his left lower back on the stairs.  Continued pain.  No loss of bladder or bowel control.  No fevers.  He is not on anticoagulation.  States he has a scrape on his forearm but otherwise feels fine. Past Medical History:  Diagnosis Date  . Alcohol abuse   . Distal radius fracture, right 11/01/2015   comminuted  . Hyperlipemia   . Polysubstance abuse (HCC)    cocaine, etoh, opiates    There are no active problems to display for this patient.   Past Surgical History:  Procedure Laterality Date  . CARPAL TUNNEL RELEASE Right 11/04/2015   Procedure: CARPAL TUNNEL RELEASE;  Surgeon: Dairl Ponder, MD;  Location: [facility];  Service: Orthopedics;  Laterality: Right;  . OPEN REDUCTION INTERNAL FIXATION (ORIF) DISTAL RADIAL FRACTURE Right 11/04/2015   Procedure: OPEN REDUCTION INTERNAL FIXATION (ORIF) DISTAL RADIAL FRACTURE ;  Surgeon: Dairl Ponder, MD;  Location: [facility];  Service: Orthopedics;  Laterality: Right;  . ORIF CLAVICLE FRACTURE Right 02/06/2014  . SHOULDER SURGERY Right         Home Medications    Prior to Admission medications   Medication Sig Start Date End Date Taking? Authorizing Provider  methocarbamol (ROBAXIN) 500 MG tablet Take 1 tablet (500 mg total) by mouth every 8 (eight) hours as needed for muscle spasms. 04/25/18   Benjiman Core, MD    Family History History reviewed. No pertinent family history.  Social History Social History   Tobacco Use  . Smoking status: Current Every Day Smoker    Packs/day: 2.00    Types: Cigarettes  . Smokeless tobacco: Never Used  Substance Use  Topics  . Alcohol use: Yes    Alcohol/week: 6.0 standard drinks    Types: 6 Cans of beer per week    Comment: daily  . Drug use: Yes    Types: Cocaine, Marijuana, Methamphetamines    Comment: cocaine used 01/17/17     Allergies   Patient has no known allergies.   Review of Systems Review of Systems  Constitutional: Negative for appetite change.  Respiratory: Negative for shortness of breath.   Cardiovascular: Negative for chest pain.  Gastrointestinal: Negative for abdominal pain.  Genitourinary: Negative for dysuria.  Musculoskeletal: Positive for back pain.  Neurological: Negative for weakness.  Psychiatric/Behavioral: Negative for confusion.     Physical Exam Updated Vital Signs BP 122/80   Pulse 64   Temp 98 F (36.7 C) (Oral)   Resp 16   Ht 5\' 6"  (1.676 m)   Wt 72.6 kg   SpO2 97%   BMI 25.82 kg/m   Physical Exam  Constitutional: He appears well-developed.  HENT:  Head: Atraumatic.  Neck: Neck supple.  Cardiovascular: Normal rate.  Pulmonary/Chest: He exhibits no tenderness.  Abdominal: There is no tenderness.  Musculoskeletal: He exhibits tenderness.  Abrasion with tenderness over left lower back.  Horizontal and does have tenderness over the musculature and somewhat over the lateral aspect of the left lower lumbar spine.  Neurovascular intact in bilateral feet.  No tenderness over pelvis.  Neurological: He is  alert.  Skin: Skin is warm. Capillary refill takes less than 2 seconds.     ED Treatments / Results  Labs (all labs ordered are listed, but only abnormal results are displayed) Labs Reviewed - No data to display  EKG None  Radiology Dg Lumbar Spine Complete  Result Date: 04/25/2018 CLINICAL DATA:  Recent fall with back pain, initial encounter EXAM: LUMBAR SPINE - COMPLETE 4+ VIEW COMPARISON:  02/06/2005 FINDINGS: Five lumbar type vertebral bodies are well visualized. Vertebral body height is well maintained. No pars defects or  anterolisthesis is noted. Mild osteophytic changes are noted. No soft tissue changes are noted. IMPRESSION: Mild degenerative change without acute abnormality. Electronically Signed   By: Alcide Clever M.D.   On: 04/25/2018 11:10    Procedures Procedures (including critical care time)  Medications Ordered in ED Medications - No data to display   Initial Impression / Assessment and Plan / ED Course  I have reviewed the triage vital signs and the nursing notes.  Pertinent labs & imaging results that were available during my care of the patient were reviewed by me and considered in my medical decision making (see chart for details).     Patient with fall and back pain.  X-ray reassuring.  No red flags.  Likely contusion.  Treat with muscle relaxer.  Discharge home.  Final Clinical Impressions(s) / ED Diagnoses   Final diagnoses:  Strain of lumbar region, initial encounter  Contusion of lower back, initial encounter    ED Discharge Orders         Ordered    methocarbamol (ROBAXIN) 500 MG tablet  Every 8 hours PRN     04/25/18 1245           Benjiman Core, MD 04/25/18 1247

## 2018-05-01 ENCOUNTER — Emergency Department (HOSPITAL_COMMUNITY)
Admission: EM | Admit: 2018-05-01 | Discharge: 2018-05-01 | Disposition: A | Discharge status: Home / Self Care | Payer: Self-pay

## 2018-05-02 DIAGNOSIS — F10120 Alcohol abuse with intoxication, uncomplicated: Secondary | ICD-10-CM | POA: Insufficient documentation

## 2018-05-02 DIAGNOSIS — Z23 Encounter for immunization: Secondary | ICD-10-CM | POA: Insufficient documentation

## 2018-05-02 DIAGNOSIS — W540XXA Bitten by dog, initial encounter: Secondary | ICD-10-CM | POA: Insufficient documentation

## 2018-05-02 DIAGNOSIS — S80872A Other superficial bite, left lower leg, initial encounter: Secondary | ICD-10-CM | POA: Insufficient documentation

## 2018-05-02 DIAGNOSIS — S80871A Other superficial bite, right lower leg, initial encounter: Secondary | ICD-10-CM | POA: Insufficient documentation

## 2018-05-02 DIAGNOSIS — Y929 Unspecified place or not applicable: Secondary | ICD-10-CM | POA: Insufficient documentation

## 2018-05-02 DIAGNOSIS — Y998 Other external cause status: Secondary | ICD-10-CM | POA: Insufficient documentation

## 2018-05-02 DIAGNOSIS — F17210 Nicotine dependence, cigarettes, uncomplicated: Secondary | ICD-10-CM | POA: Insufficient documentation

## 2018-05-02 DIAGNOSIS — Y9389 Activity, other specified: Secondary | ICD-10-CM | POA: Insufficient documentation

## 2018-05-03 ENCOUNTER — Emergency Department (HOSPITAL_COMMUNITY)
Admission: EM | Admit: 2018-05-03 | Discharge: 2018-05-03 | Disposition: A | Discharge status: Home / Self Care | Payer: Self-pay | Attending: Emergency Medicine | Admitting: Emergency Medicine

## 2018-05-03 ENCOUNTER — Other Ambulatory Visit: Payer: Self-pay

## 2018-05-03 ENCOUNTER — Encounter (HOSPITAL_COMMUNITY): Payer: Self-pay | Admitting: *Deleted

## 2018-05-03 DIAGNOSIS — F10920 Alcohol use, unspecified with intoxication, uncomplicated: Secondary | ICD-10-CM

## 2018-05-03 DIAGNOSIS — W540XXA Bitten by dog, initial encounter: Secondary | ICD-10-CM

## 2018-05-03 MED ORDER — TETANUS-DIPHTH-ACELL PERTUSSIS 5-2.5-18.5 LF-MCG/0.5 IM SUSP
0.5000 mL | Freq: Once | INTRAMUSCULAR | Status: AC
  Administered 2018-05-03: 0.5 mL via INTRAMUSCULAR
  Filled 2018-05-03: qty 0.5

## 2018-05-03 MED ORDER — AMOXICILLIN-POT CLAVULANATE 875-125 MG PO TABS: 1.0000 | ORAL_TABLET | Freq: Two times a day (BID) | ORAL | 0 refills | Status: AC

## 2018-05-03 NOTE — ED Triage Notes (Signed)
Pt has dog bites to bilateral lower legs; no bleeding or drainage noted and looks to be in the healing stages; pt states he was bit x 2 days ago

## 2018-05-03 NOTE — ED Provider Notes (Signed)
[facility] EMERGENCY DEPARTMENT Provider Note   CSN: [id number] Arrival date & time: 05/02/18  2355     History   Chief Complaint Chief Complaint  Patient presents with  . Animal Bite    HPI Lucas [name] is a 47 y.o. male.  HPI  This is a 47 year old male with history of polysubstance abuse and alcohol abuse who presents with complaints of a dog bite.  Patient reports that he was bitten by dog on both legs approximately 2 days ago.  Dog was not his.  Unknown immunization status but "the dog was not foaming at the mouth."  Patient denies any significant pain he just states he wants to have it checked out.  He denies any fevers or redness.     As I was walking on the door, patient also reports that he has had intermittent chest pain.  He was seen and evaluated for the same several weeks ago and left AGAINST MEDICAL ADVICE.  Work-up was negative.  He is unable to elaborate.  He is not having any ongoing chest pain.  Denies cough, fever.  Past Medical History:  Diagnosis Date  . Alcohol abuse   . Distal radius fracture, right 11/01/2015   comminuted  . Hyperlipemia   . Polysubstance abuse (HCC)    cocaine, etoh, opiates    There are no active problems to display for this patient.   Past Surgical History:  Procedure Laterality Date  . CARPAL TUNNEL RELEASE Right 11/04/2015   Procedure: CARPAL TUNNEL RELEASE;  Surgeon: Dairl Ponder, MD;  Location: [facility];  Service: Orthopedics;  Laterality: Right;  . OPEN REDUCTION INTERNAL FIXATION (ORIF) DISTAL RADIAL FRACTURE Right 11/04/2015   Procedure: OPEN REDUCTION INTERNAL FIXATION (ORIF) DISTAL RADIAL FRACTURE ;  Surgeon: Dairl Ponder, MD;  Location: [facility];  Service: Orthopedics;  Laterality: Right;  . ORIF CLAVICLE FRACTURE Right 02/06/2014  . SHOULDER SURGERY Right         Home Medications    Prior to Admission medications   Medication Sig Start Date End Date Taking?  Authorizing Provider  amoxicillin-clavulanate (AUGMENTIN) 875-125 MG tablet Take 1 tablet by mouth every 12 (twelve) hours. 05/03/18   Tashan Kreitzer, Mayer Masker, MD  methocarbamol (ROBAXIN) 500 MG tablet Take 1 tablet (500 mg total) by mouth every 8 (eight) hours as needed for muscle spasms. 04/25/18   Benjiman Core, MD    Family History History reviewed. No pertinent family history.  Social History Social History   Tobacco Use  . Smoking status: Current Every Day Smoker    Packs/day: 2.00    Types: Cigarettes  . Smokeless tobacco: Never Used  Substance Use Topics  . Alcohol use: Yes    Alcohol/week: 6.0 standard drinks    Types: 6 Cans of beer per week    Comment: daily  . Drug use: Yes    Types: Cocaine, Marijuana, Methamphetamines    Comment: cocaine used 01/17/17     Allergies   Patient has no known allergies.   Review of Systems Review of Systems  Constitutional: Negative for fever.  Respiratory: Negative for cough and shortness of breath.   Cardiovascular: Positive for chest pain.  Skin: Positive for wound. Negative for color change.  All other systems reviewed and are negative.    Physical Exam Updated Vital Signs BP (!) 132/92 (BP Location: Right Arm)   Pulse 93   Temp 97.9 F (36.6 C) (Oral)   Resp 17   Ht 1.676 m (5'  6")   Wt 72.5 kg   SpO2 95%   BMI 25.80 kg/m   Physical Exam  Constitutional: He is oriented to person, place, and time.  Disheveled appearing, sleeping upon my arrival into the room, appears intoxicated  HENT:  Head: Normocephalic and atraumatic.  Eyes: Pupils are equal, round, and reactive to light.  Neck: Neck supple.  Cardiovascular: Normal rate, regular rhythm and normal heart sounds.  No murmur heard. Pulmonary/Chest: Effort normal and breath sounds normal. No respiratory distress. He has no wheezes.  Abdominal: Soft. Bowel sounds are normal. There is no tenderness. There is no rebound.  Musculoskeletal: He exhibits no edema,  tenderness or deformity.  Neurological: He is alert and oriented to person, place, and time.  Skin: Skin is warm and dry.  Well-healing scabs noted over the bilateral lower extremities in the distribution of puncture wounds, appear to be greater than 23 days old, no redness or streaking, no drainage  Psychiatric: He has a normal mood and affect.  Nursing note and vitals reviewed.    ED Treatments / Results  Labs (all labs ordered are listed, but only abnormal results are displayed) Labs Reviewed - No data to display  EKG EKG Interpretation  Date/Time:  Tuesday May 03 2018 02:55:35 EDT Ventricular Rate:  68 PR Interval:    QRS Duration: 85 QT Interval:  366 QTC Calculation: 390 R Axis:   90 Text Interpretation:  Sinus rhythm Consider left atrial enlargement Borderline right axis deviation Baseline wander in lead(s) V3 V6 No significant change since last tracing Confirmed by Ross Marcus ([id number]) on 05/03/2018 3:36:10 AM   Radiology No results found.  Procedures Procedures (including critical care time)  Medications Ordered in ED Medications  Tdap (BOOSTRIX) injection 0.5 mL (0.5 mLs Intramuscular Given 05/03/18 0257)     Initial Impression / Assessment and Plan / ED Course  I have reviewed the triage vital signs and the nursing notes.  Pertinent labs & imaging results that were available during my care of the patient were reviewed by me and considered in my medical decision making (see chart for details).     Patient presents with primary complaint of dog bite.  Reports that this was 2 days ago.  However, exam would suggest that these wounds are older.  There is no evidence of infection.  Patient is refusing rabies prophylaxis.  He did consent to Tdap.  Do not feel he would likely benefit from antibiotics as I think that his wounds are fairly mature at this point; however, they were offered and patient accepted.  Regarding his chest pain.  Patient is unable to fully  elaborate.  He is not having any ongoing pain.  Screening EKG does not show any evidence of arrhythmia or ischemia.  His breath sounds are clear and he is in no acute distress.  Given that this was not a primary complaint and he is unable to further elaborate, do not feel he needs further work-up at this time.  After history, exam, and medical workup I feel the patient has been appropriately medically screened and is safe for discharge home. Pertinent diagnoses were discussed with the patient. Patient was given return precautions.   Final Clinical Impressions(s) / ED Diagnoses   Final diagnoses:  Dog bite, initial encounter  Alcoholic intoxication without complication [facility])    ED Discharge Orders         Ordered    amoxicillin-clavulanate (AUGMENTIN) 875-125 MG tablet  Every 12 hours  05/03/18 0335           Shon Baton, MD 05/03/18 [phone]

## 2018-05-04 ENCOUNTER — Emergency Department (HOSPITAL_COMMUNITY)
Admission: EM | Admit: 2018-05-04 | Discharge: 2018-05-04 | Discharge status: Against Medical Advice | Payer: Self-pay | Attending: Emergency Medicine | Admitting: Emergency Medicine

## 2018-05-04 NOTE — ED Provider Notes (Signed)
Pt had come in to the ER with cc of seizure. When I went in the room, there was no one there. I presumed that pt had gone to the bathroom. The RN just came over and stated that pt had left AMA. The nurse was doing triage intake and asking questions about his seizures, and pt allegedly stated that "he was good" and decided to leave.  I never had a chance to speak with the patient reassessed him.   Derwood Kaplan, MD 05/04/18 [phone]

## 2018-05-04 NOTE — ED Notes (Signed)
While this nurse triaging the pt. Pt gets up from the stretcher and states "you know what fuck it, I'll be good." Pt encouraged to stay and informed of the risks of leaving AMA. Pt verbalized understanding of D/C instructions. Pt ambulatory to waiting room with steady gate.

## 2018-10-06 IMAGING — DX DG CHEST 2V
2 series · 2 of 2 positions shown · non-contrast
Comparison: 11/01/2015

CLINICAL DATA: Flu-like symptoms for 3 days.

EXAM:
CHEST  2 VIEW

[chest pa]
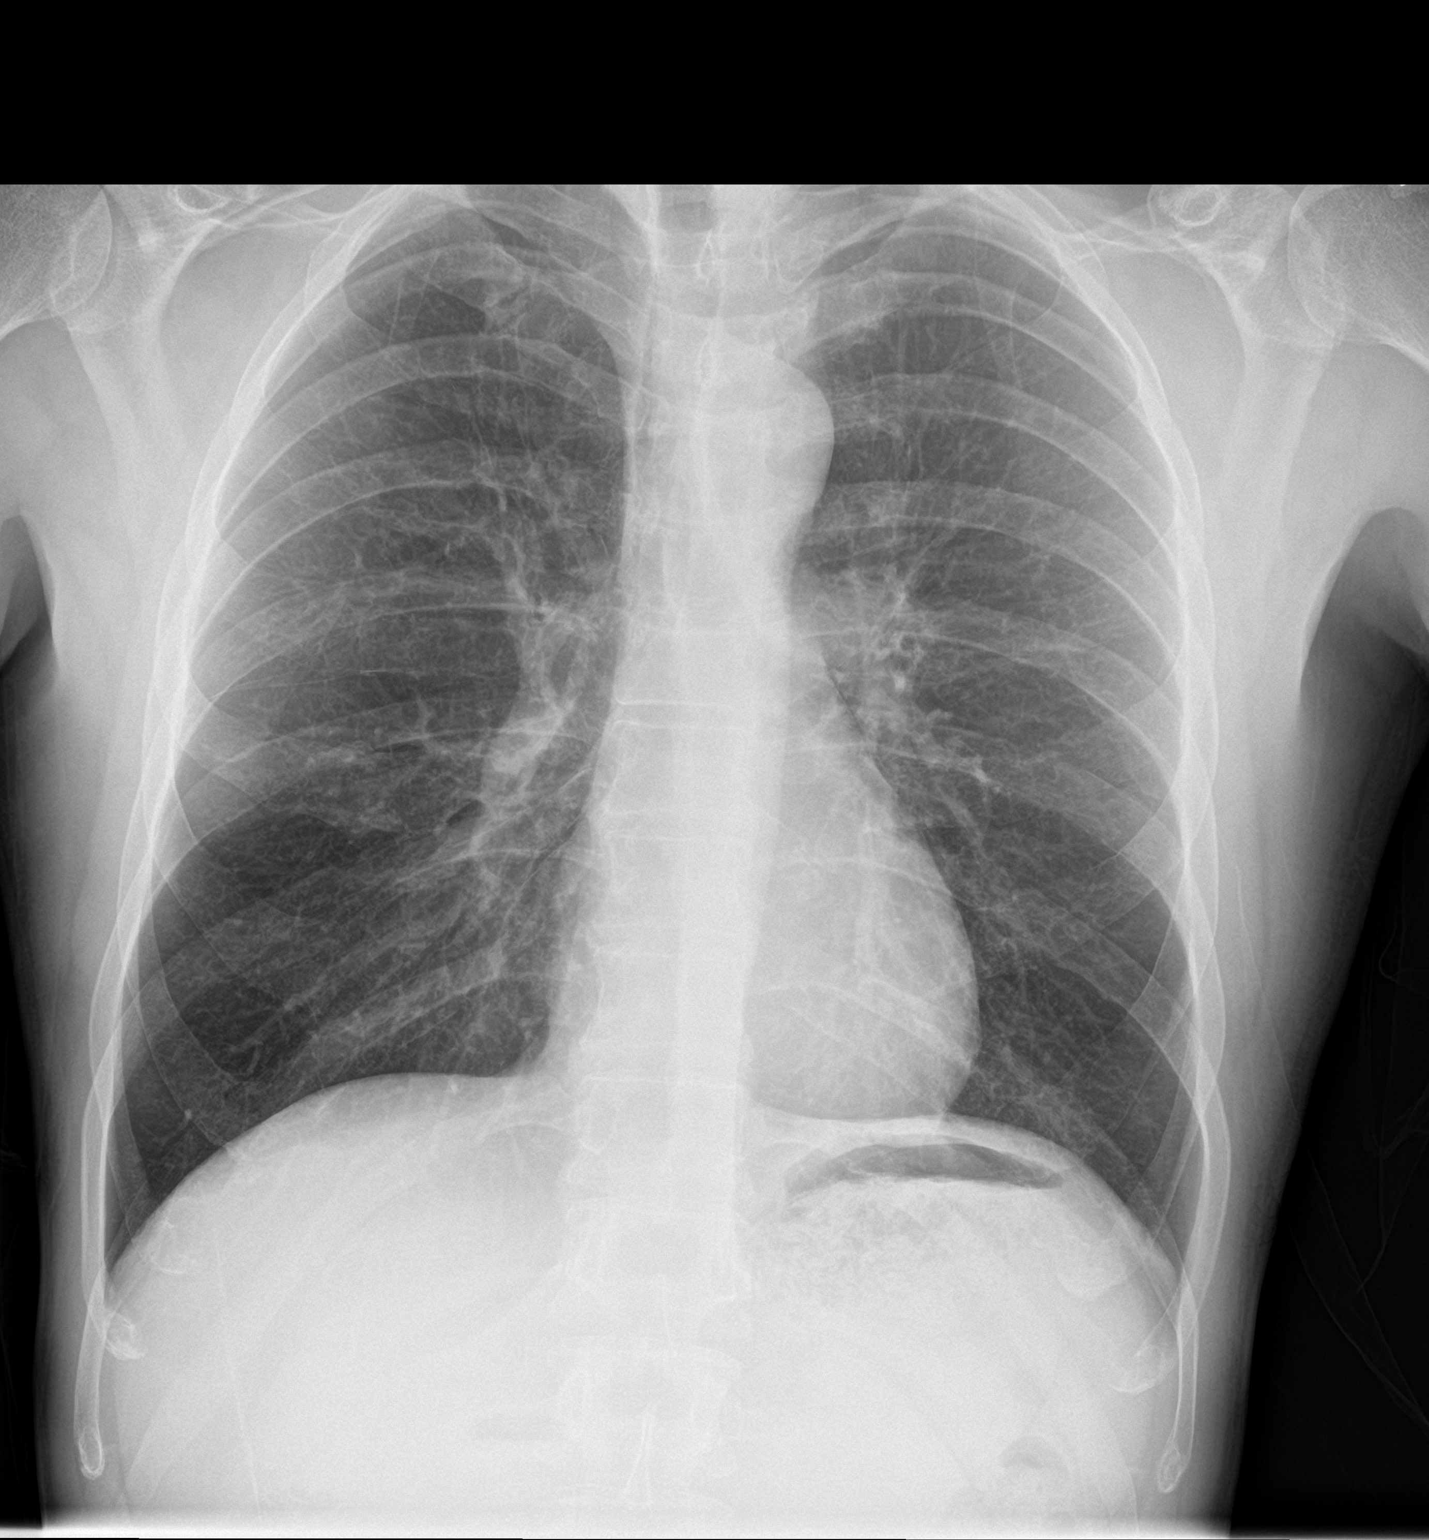

[chest lat]
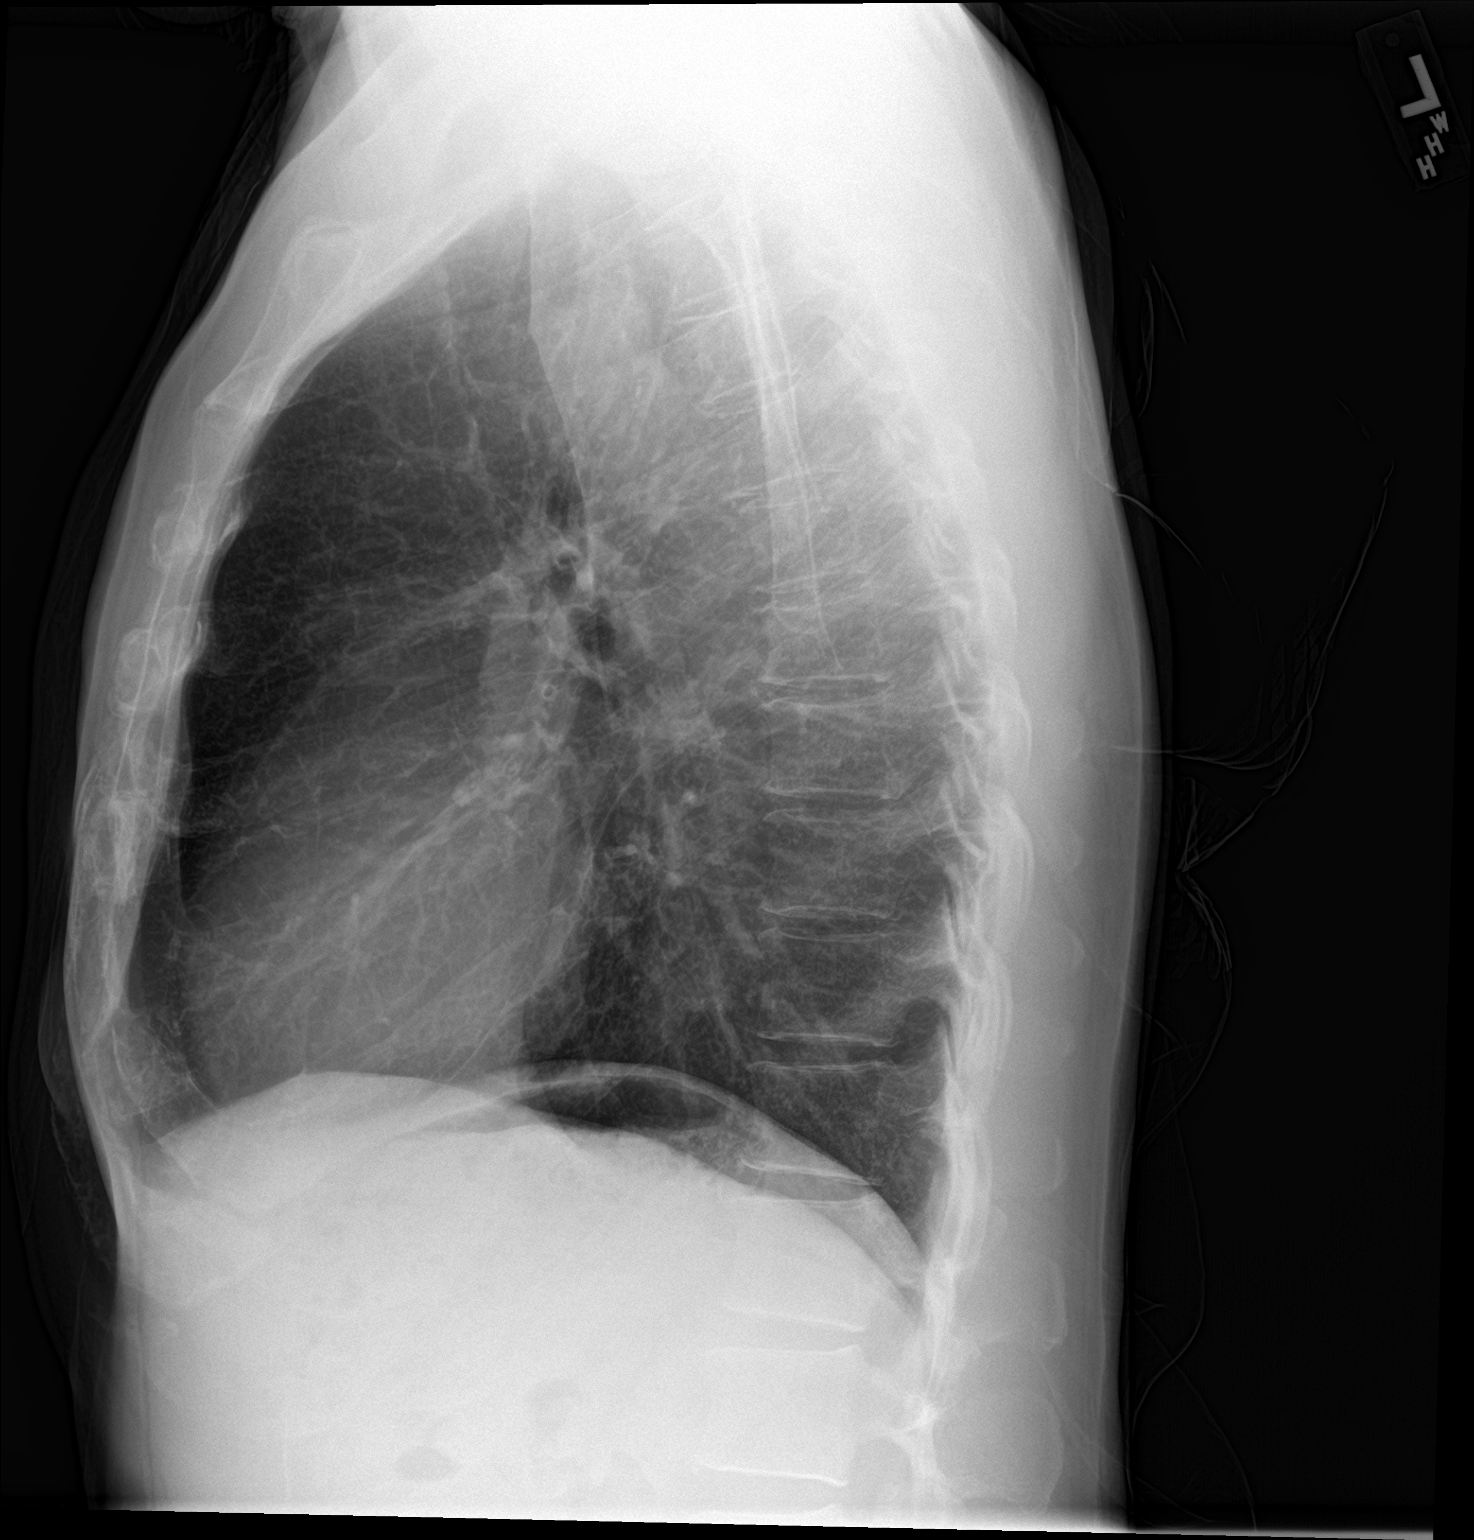

[2 of 2 positions shown; findings below may reference images not displayed]

FINDINGS: Emphysematous changes in the lungs. Scattered fibrosis.
Peribronchial thickening suggesting chronic bronchitis. Normal heart
size and pulmonary vascularity. No focal airspace disease or
consolidation in the lungs. No blunting of costophrenic angles. No
pneumothorax. Mediastinal contours appear intact. Postoperative
plate and screw fixation of the right clavicle.
IMPRESSION: Emphysematous and chronic bronchitic changes in the lungs. No
evidence of active pulmonary disease.

## 2019-02-04 IMAGING — DX DG CHEST 2V
2 series · 2 of 2 positions shown · non-contrast
Comparison: Radiograph 09/24/2016

CLINICAL DATA: Cough.  Mid chest pain and shortness of breath.

EXAM:
CHEST  2 VIEW

[chest pa]
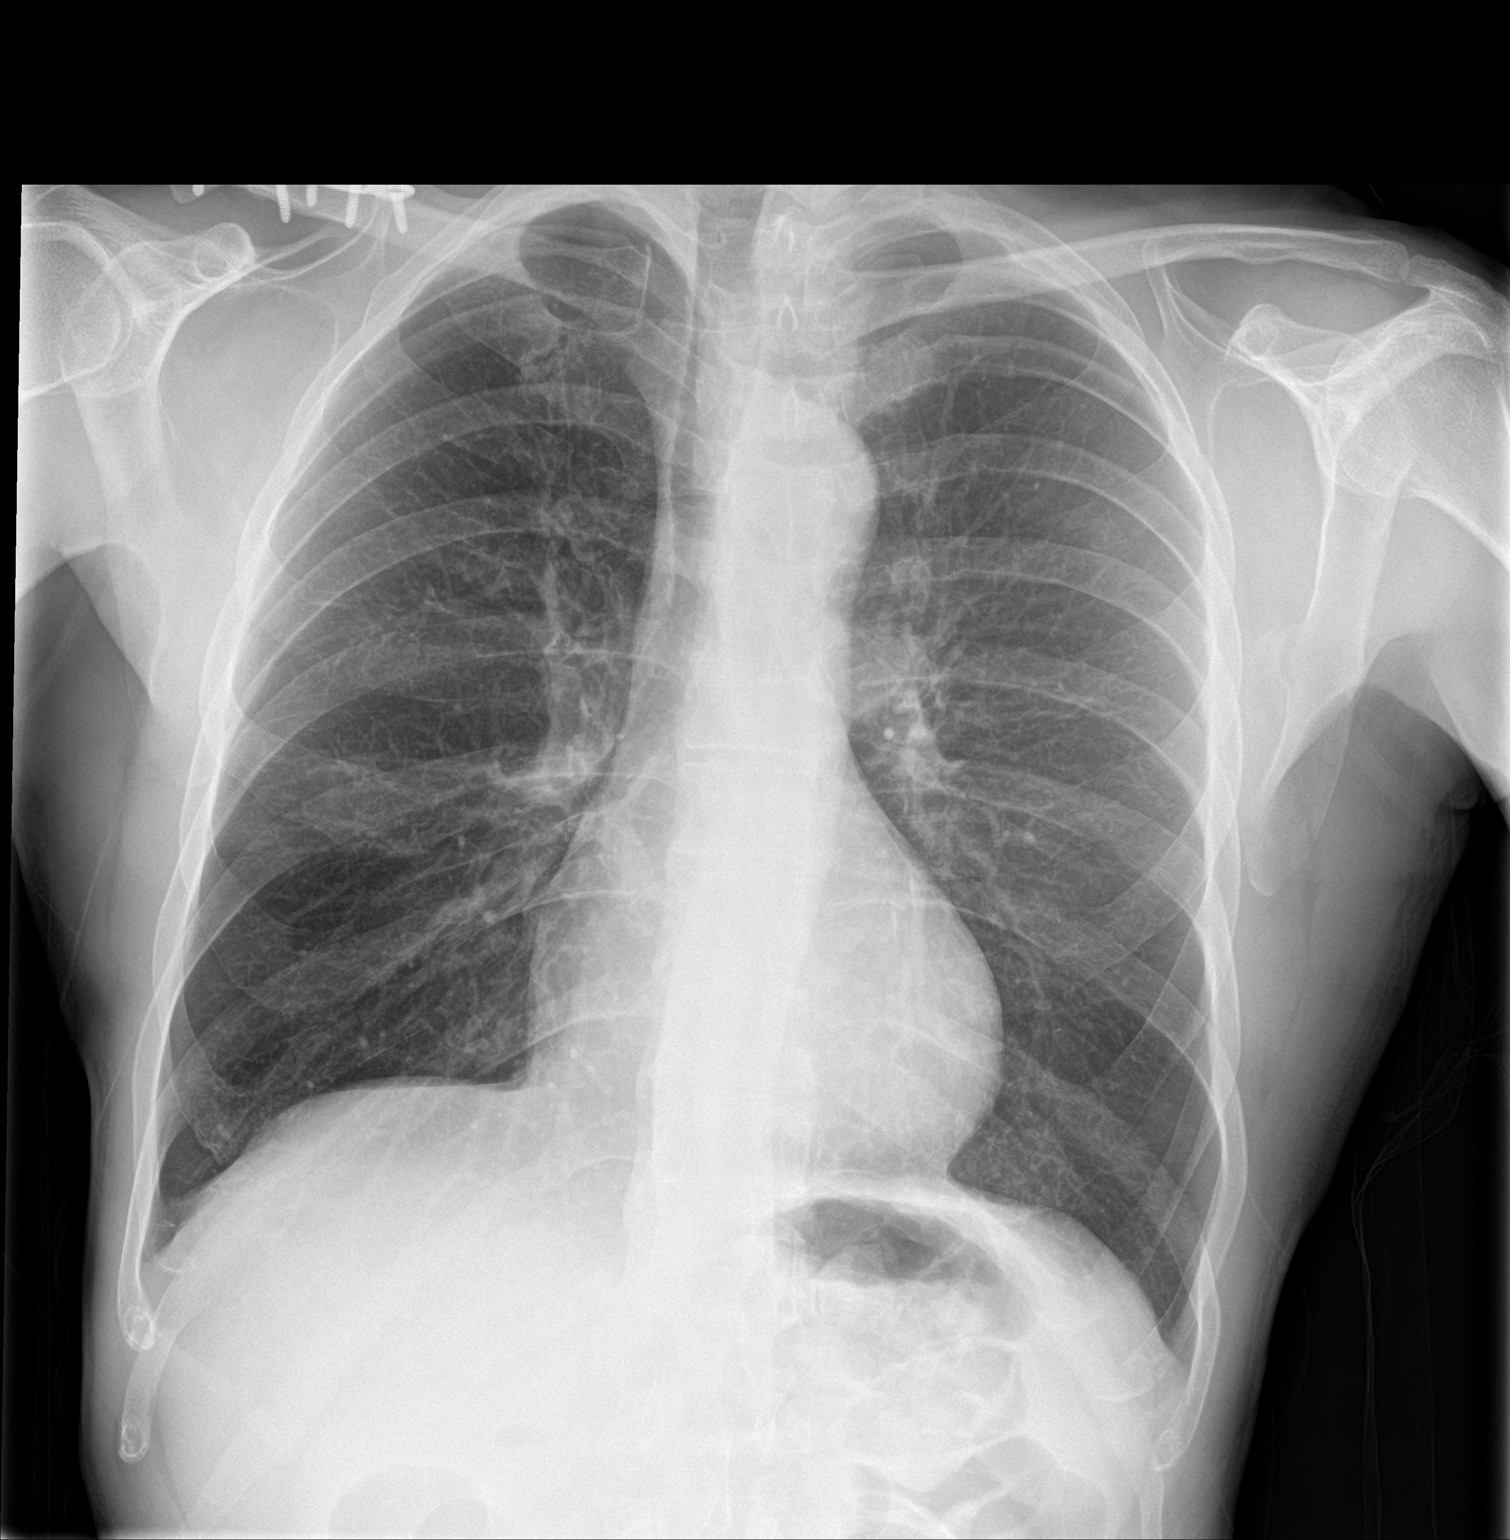

[chest lat]
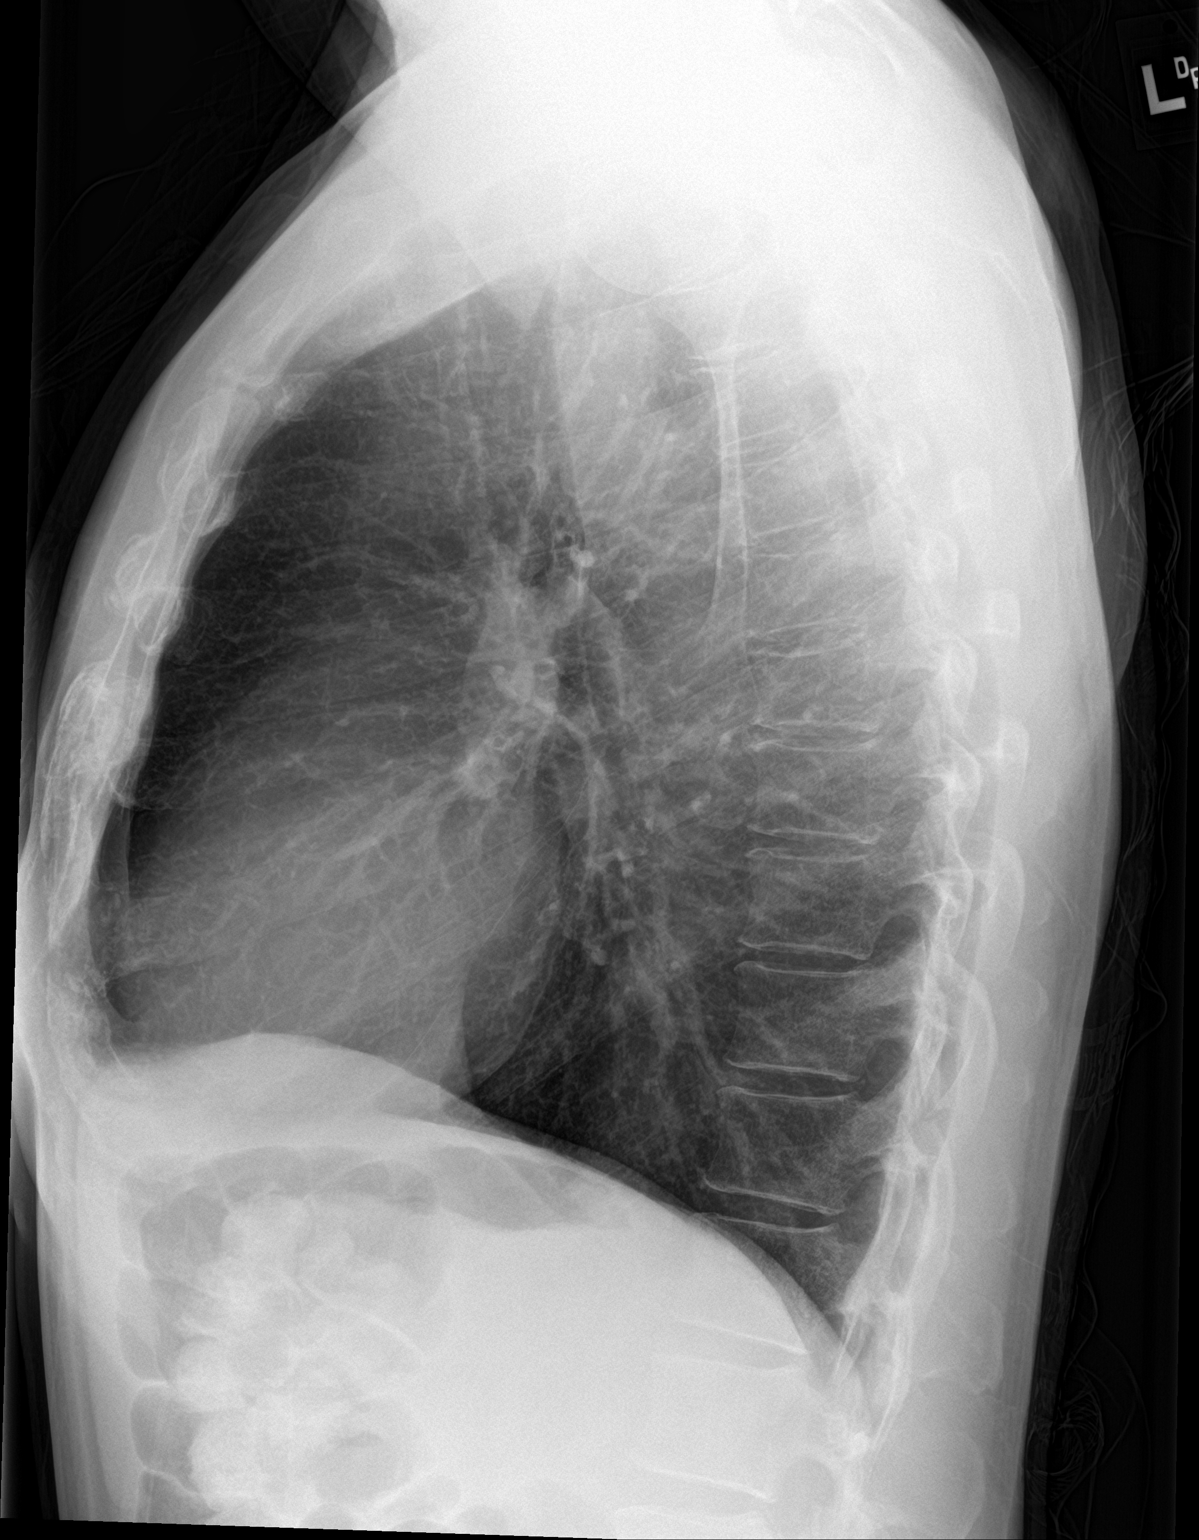

[2 of 2 positions shown; findings below may reference images not displayed]

FINDINGS: Stable hyperinflation and bronchitic change. The cardiomediastinal
contours are normal. Pulmonary vasculature is normal. No
consolidation, pleural effusion, or pneumothorax. No acute osseous
abnormalities are seen. Plate and screw fixation of the distal right
clavicle.
IMPRESSION: Chronic hyperinflation and bronchitic change. No superimposed acute
abnormality.

## 2019-02-05 ENCOUNTER — Emergency Department
Admission: EM | Admit: 2019-02-05 | Discharge: 2019-02-05 | Disposition: A | Discharge status: Home / Self Care | Payer: Self-pay | Attending: Emergency Medicine | Admitting: Emergency Medicine

## 2019-02-05 ENCOUNTER — Other Ambulatory Visit: Payer: Self-pay

## 2019-02-05 DIAGNOSIS — Z5321 Procedure and treatment not carried out due to patient leaving prior to being seen by health care provider: Secondary | ICD-10-CM | POA: Insufficient documentation

## 2019-02-05 DIAGNOSIS — E860 Dehydration: Secondary | ICD-10-CM | POA: Insufficient documentation

## 2019-02-05 NOTE — ED Notes (Signed)
Pt refused to wear mask in triage room. After explaining the importance of wearing mask pt then put it on. While this EDT as getting pt vitals pt asked what was his temp. EDT  told him that his temp  Was 98.7 pt stated that it was wrong and that it was 104. Pt them proceeded to take off bp cuff and pulse ox and threw them and stated that he couldn't do this and wanted to leave and walked out of triage room and ran out of ED doors.

## 2019-02-05 NOTE — ED Notes (Addendum)
Pt observed briskly walking out of the triage area stating that he was leaving. Pt observed walking up parking lot towards [place]. Pt does not appear to be in any distress.

## 2019-02-05 NOTE — ED Notes (Addendum)
Pt walked briskly into the ED stating that he needed to be seen for possible dehydration. When asked to wear mask pt states that he cannot wear the mask because he will vomit if he wears it. Pt does not appear to be in any distress at this time.

## 2020-05-11 IMAGING — DX DG CHEST 2V
2 series · 2 of 2 positions shown · non-contrast
Comparison: 01/05/2018

CLINICAL DATA: Chest pain, smoker, admits to cocaine use today

EXAM:
CHEST - 2 VIEW

[chest lat]
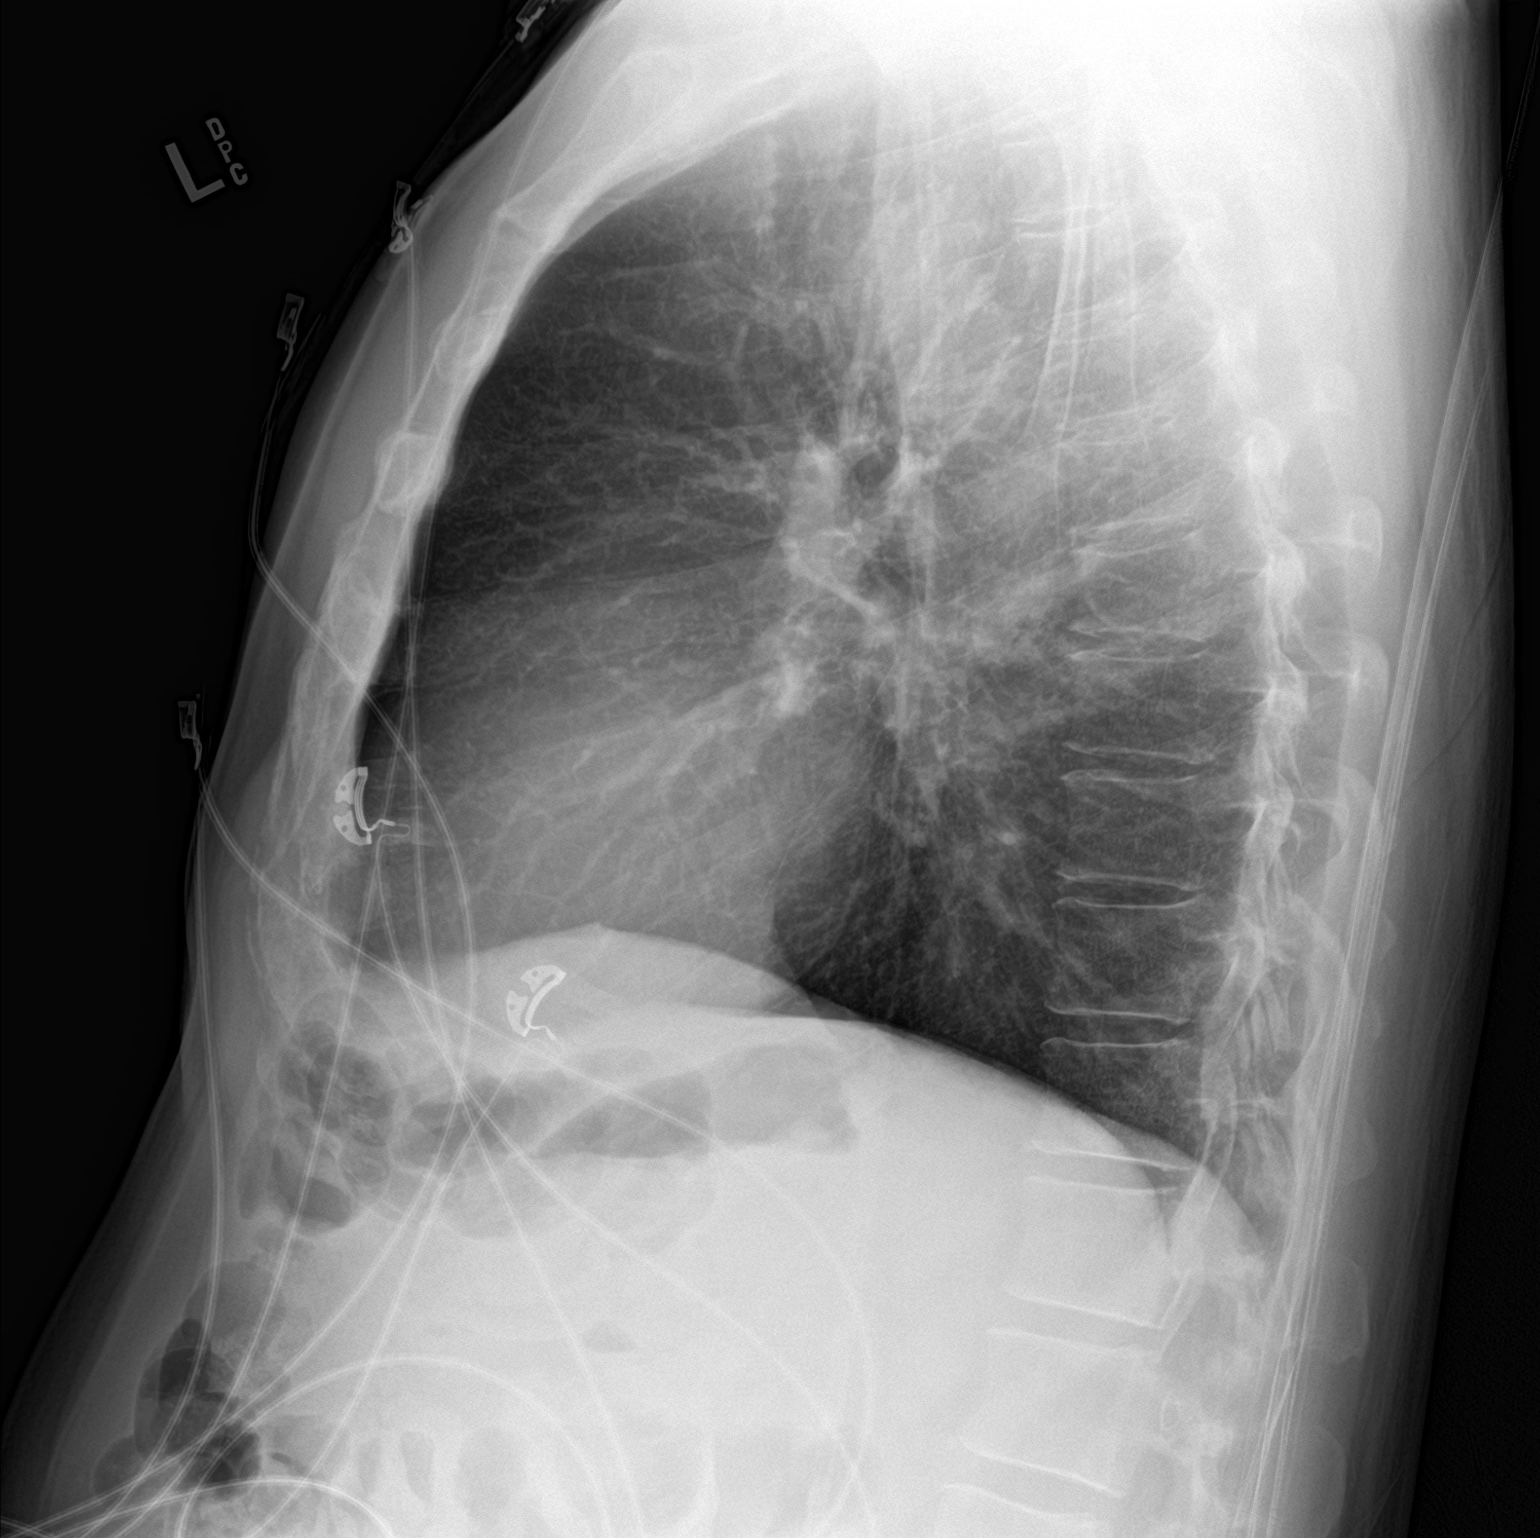

[chest ap]
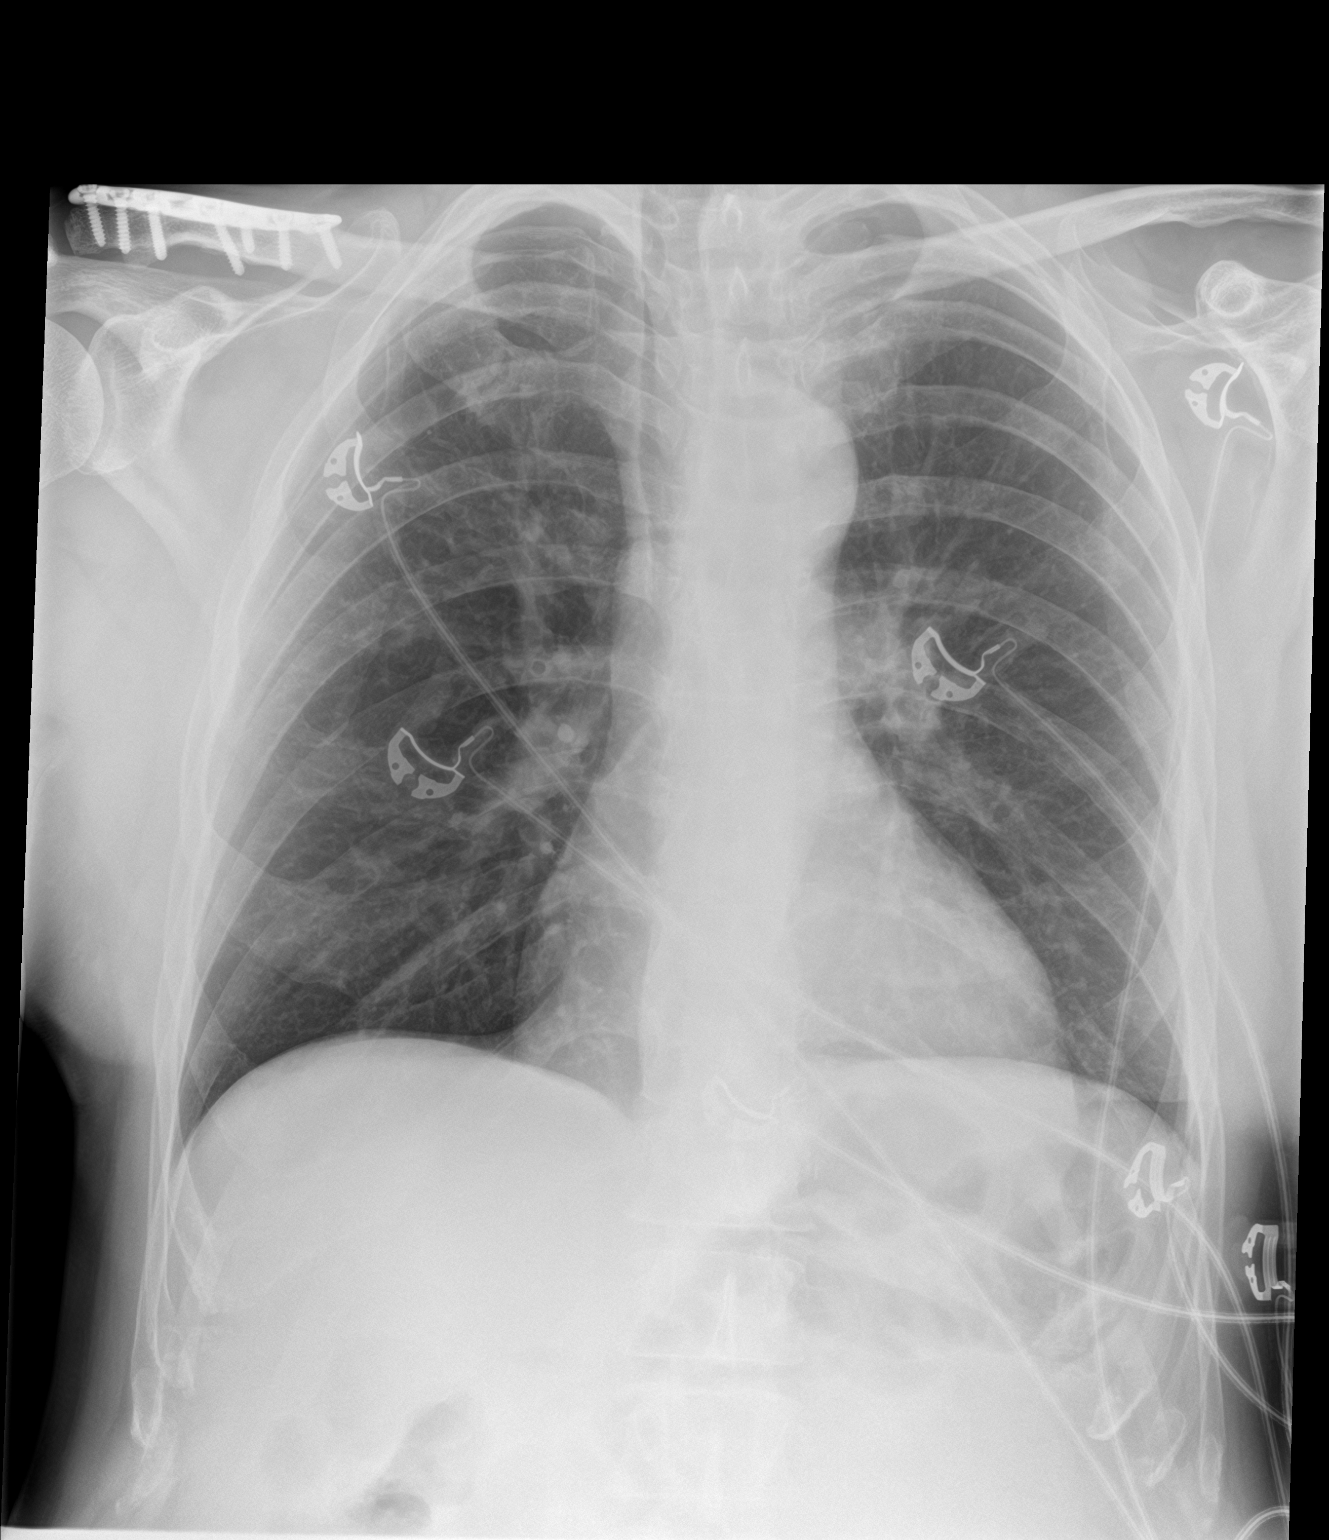

[2 of 2 positions shown; findings below may reference images not displayed]

FINDINGS: Normal heart size, mediastinal contours, and pulmonary vascularity.

Minimal chronic central peribronchial thickening.

Lungs otherwise clear.

No infiltrate, pleural effusion or pneumothorax.

No acute osseous findings.

Prior RIGHT clavicular ORIF.
IMPRESSION: No acute abnormalities.
